# Patient Record
Sex: Female | Born: 1950 | Race: Black or African American | Hispanic: No | Smoking: Current every day smoker
Health system: Southern US, Community
[De-identification: ages and names within clinical notes are randomized; demographics above are authoritative.]

## PROBLEM LIST (undated history)

## (undated) DIAGNOSIS — F419 Anxiety disorder, unspecified: Secondary | ICD-10-CM

## (undated) DIAGNOSIS — F329 Major depressive disorder, single episode, unspecified: Secondary | ICD-10-CM

## (undated) DIAGNOSIS — F32A Depression, unspecified: Secondary | ICD-10-CM

## (undated) DIAGNOSIS — G709 Myoneural disorder, unspecified: Secondary | ICD-10-CM

## (undated) DIAGNOSIS — M1990 Unspecified osteoarthritis, unspecified site: Secondary | ICD-10-CM

## (undated) DIAGNOSIS — E785 Hyperlipidemia, unspecified: Secondary | ICD-10-CM

## (undated) DIAGNOSIS — T7840XA Allergy, unspecified, initial encounter: Secondary | ICD-10-CM

## (undated) DIAGNOSIS — F17200 Nicotine dependence, unspecified, uncomplicated: Secondary | ICD-10-CM

## (undated) DIAGNOSIS — I10 Essential (primary) hypertension: Secondary | ICD-10-CM

## (undated) HISTORY — PX: DENTAL SURGERY: SHX609

## (undated) HISTORY — DX: Hyperlipidemia, unspecified: E78.5

## (undated) HISTORY — DX: Anxiety disorder, unspecified: F41.9

## (undated) HISTORY — DX: Unspecified osteoarthritis, unspecified site: M19.90

## (undated) HISTORY — DX: Essential (primary) hypertension: I10

## (undated) HISTORY — DX: Nicotine dependence, unspecified, uncomplicated: F17.200

## (undated) HISTORY — DX: Myoneural disorder, unspecified: G70.9

## (undated) HISTORY — DX: Allergy, unspecified, initial encounter: T78.40XA

## (undated) HISTORY — DX: Major depressive disorder, single episode, unspecified: F32.9

## (undated) HISTORY — DX: Depression, unspecified: F32.A

---

## 2008-06-25 ENCOUNTER — Encounter: Admission: RE | Admit: 2008-06-25 | Discharge: 2008-09-20 | Payer: Self-pay | Admitting: Internal Medicine

## 2008-09-22 HISTORY — PX: WRIST SURGERY: SHX841

## 2010-11-15 HISTORY — PX: COLON SURGERY: SHX602

## 2010-11-28 ENCOUNTER — Other Ambulatory Visit: Payer: Self-pay | Admitting: Internal Medicine

## 2010-12-21 ENCOUNTER — Ambulatory Visit (AMBULATORY_SURGERY_CENTER): Payer: 59 | Admitting: *Deleted

## 2010-12-21 ENCOUNTER — Encounter: Payer: Self-pay | Admitting: Internal Medicine

## 2010-12-21 VITALS — Ht 62.5 in | Wt 153.0 lb

## 2010-12-21 DIAGNOSIS — Z1211 Encounter for screening for malignant neoplasm of colon: Secondary | ICD-10-CM

## 2010-12-21 MED ORDER — SUPREP BOWEL PREP KIT 17.5-3.13-1.6 GM/177ML PO SOLN: 1.0000 | ORAL | Status: DC

## 2011-01-04 ENCOUNTER — Ambulatory Visit (AMBULATORY_SURGERY_CENTER): Payer: 59 | Admitting: Internal Medicine

## 2011-01-04 ENCOUNTER — Encounter: Payer: Self-pay | Admitting: Internal Medicine

## 2011-01-04 VITALS — BP 150/79 | HR 74 | Temp 98.1°F | Resp 12 | Ht 62.0 in | Wt 153.0 lb

## 2011-01-04 DIAGNOSIS — D126 Benign neoplasm of colon, unspecified: Secondary | ICD-10-CM

## 2011-01-04 DIAGNOSIS — Z1211 Encounter for screening for malignant neoplasm of colon: Secondary | ICD-10-CM

## 2011-01-04 MED ORDER — SODIUM CHLORIDE 0.9 % IV SOLN: 500.0000 mL | INTRAVENOUS | Status: DC

## 2011-01-04 NOTE — Patient Instructions (Signed)
Please read the handouts given to you by your recovery room nurse.    You may resume all of your routine medications today.  Try to increase the fiber in your diet.    Use your analpram as needed per Dr. Juanda Chance regarding your hemorrhoids.   Your polyp results will be mailed to you within 2 weeks.   Call us if you have any questions or concerns at [phone].   Thank-you for choosing [facility] for your healthcare needs today.

## 2011-01-05 ENCOUNTER — Telehealth: Payer: Self-pay

## 2011-01-05 NOTE — Telephone Encounter (Signed)

## 2011-01-10 ENCOUNTER — Encounter: Payer: Self-pay | Admitting: Internal Medicine

## 2012-07-15 ENCOUNTER — Other Ambulatory Visit: Payer: 59

## 2012-07-15 ENCOUNTER — Other Ambulatory Visit: Payer: Self-pay | Admitting: Geriatric Medicine

## 2012-07-15 DIAGNOSIS — I10 Essential (primary) hypertension: Secondary | ICD-10-CM

## 2012-07-15 DIAGNOSIS — E785 Hyperlipidemia, unspecified: Secondary | ICD-10-CM

## 2012-07-16 LAB — LIPID PANEL
Chol/HDL Ratio: 4.2 ratio units (ref 0.0–4.4)
Cholesterol, Total: 184 mg/dL (ref 100–199)
HDL: 44 mg/dL (ref >39)
LDL Calculated: 127 mg/dL — ABNORMAL HIGH (ref 0–99)
Triglycerides: 65 mg/dL (ref 0–149)
VLDL Cholesterol Cal: 13 mg/dL (ref 5–40)

## 2012-07-16 LAB — BASIC METABOLIC PANEL
BUN/Creatinine Ratio: 14 (ref 11–26)
BUN: 10 mg/dL (ref 8–27)
CO2: 26 mmol/L (ref 19–28)
Calcium: 9.9 mg/dL (ref 8.6–10.2)
Chloride: 104 mmol/L (ref 97–108)
Creatinine, Ser: 0.71 mg/dL (ref 0.57–1.00)
GFR calc Af Amer: 107 mL/min/{1.73_m2} (ref >59)
GFR calc non Af Amer: 93 mL/min/{1.73_m2} (ref >59)
Glucose: 96 mg/dL (ref 65–99)
Potassium: 4.4 mmol/L (ref 3.5–5.2)
Sodium: 144 mmol/L (ref 134–144)

## 2012-07-16 LAB — CBC WITH DIFFERENTIAL/PLATELET
Basophils Absolute: 0 10*3/uL (ref 0.0–0.2)
Basos: 0 % (ref 0–3)
Eos: 0 % (ref 0–5)
Eosinophils Absolute: 0 10*3/uL (ref 0.0–0.4)
HCT: 42.4 % (ref 34.0–46.6)
Hemoglobin: 13.7 g/dL (ref 11.1–15.9)
Immature Grans (Abs): 0 10*3/uL (ref 0.0–0.1)
Immature Granulocytes: 0 % (ref 0–2)
Lymphocytes Absolute: 2.2 10*3/uL (ref 0.7–3.1)
Lymphs: 33 % (ref 14–46)
MCH: 28.9 pg (ref 26.6–33.0)
MCHC: 32.3 g/dL (ref 31.5–35.7)
MCV: 90 fL (ref 79–97)
Monocytes Absolute: 0.4 10*3/uL (ref 0.1–0.9)
Monocytes: 6 % (ref 4–12)
Neutrophils Absolute: 4 10*3/uL (ref 1.4–7.0)
Neutrophils Relative %: 61 % (ref 40–74)
RBC: 4.74 x10E6/uL (ref 3.77–5.28)
RDW: 14.1 % (ref 12.3–15.4)
WBC: 6.7 10*3/uL (ref 3.4–10.8)

## 2012-07-17 ENCOUNTER — Encounter: Payer: Self-pay | Admitting: Internal Medicine

## 2012-07-17 ENCOUNTER — Ambulatory Visit (INDEPENDENT_AMBULATORY_CARE_PROVIDER_SITE_OTHER): Payer: 59 | Admitting: Internal Medicine

## 2012-07-17 VITALS — BP 142/90 | HR 72 | Temp 98.4°F | Resp 12 | Wt 154.0 lb

## 2012-07-17 DIAGNOSIS — F17200 Nicotine dependence, unspecified, uncomplicated: Secondary | ICD-10-CM | POA: Insufficient documentation

## 2012-07-17 DIAGNOSIS — E785 Hyperlipidemia, unspecified: Secondary | ICD-10-CM

## 2012-07-17 DIAGNOSIS — Z6825 Body mass index (BMI) 25.0-25.9, adult: Secondary | ICD-10-CM

## 2012-07-17 DIAGNOSIS — E663 Overweight: Secondary | ICD-10-CM | POA: Insufficient documentation

## 2012-07-17 DIAGNOSIS — L723 Sebaceous cyst: Secondary | ICD-10-CM

## 2012-07-17 DIAGNOSIS — F329 Major depressive disorder, single episode, unspecified: Secondary | ICD-10-CM

## 2012-07-17 DIAGNOSIS — F32A Depression, unspecified: Secondary | ICD-10-CM

## 2012-07-17 DIAGNOSIS — F411 Generalized anxiety disorder: Secondary | ICD-10-CM | POA: Insufficient documentation

## 2012-07-17 DIAGNOSIS — F3289 Other specified depressive episodes: Secondary | ICD-10-CM

## 2012-07-17 DIAGNOSIS — I10 Essential (primary) hypertension: Secondary | ICD-10-CM | POA: Insufficient documentation

## 2012-07-17 MED ORDER — ATORVASTATIN CALCIUM 10 MG PO TABS: 20.0000 mg | ORAL_TABLET | Freq: Every day | ORAL | Status: DC

## 2012-07-17 NOTE — Assessment & Plan Note (Signed)
Mood stable with celexa.

## 2012-07-17 NOTE — Assessment & Plan Note (Signed)
Continues to smoke.  Not yet ready to quit completely.  Uses electronic cigarette.

## 2012-07-17 NOTE — Progress Notes (Signed)
Patient ID: Lauren Rosario, female   DOB: 07-11-1950, 62 y.o.   MRN: [id number]  Allergies  Allergen Reactions  . Latex Other (See Comments)    Hands blistered and broke open  . Neosporin (Neomycin-Bacitracin Zn-Polymyx) Rash    Chief Complaint  Patient presents with  . Medical Managment of Chronic Issues  . bump behind left ear    for one month  . bump on back    for one month    HPI: Patient is a 62 y.o. AA female seen in the office today for routine f/u of chronic conditions.  Has a place on her neck and one on her back for about a month.  Has put bactroban on one on her neck and it got smaller.    Is substitute teacher, works wednesdays, works with dementia patient.    Still smokes when stressed.  Uses electronic cigarette also other times.    See problems below Review of Systems:  Review of Systems  Constitutional: Negative for fever, chills and malaise/fatigue.  Eyes: Negative for blurred vision.  Respiratory: Negative for cough, shortness of breath and wheezing.   Cardiovascular: Negative for chest pain, palpitations and leg swelling.  Gastrointestinal: Negative for heartburn, nausea, vomiting, diarrhea and constipation.  Genitourinary: Negative for dysuria, urgency and frequency.  Musculoskeletal: Negative for myalgias and back pain.  Skin: Negative for rash.       2 bumps:  One behind left ear and another on her upper back   Squeezed one behind ear and it drained--she put bacitracin on it with improvement  Has had one on back recurring again and again, but doesn't want anything done with it, no redness or drainage  Neurological: Negative for dizziness and weakness.  Endo/Heme/Allergies: Does not bruise/bleed easily.  Psychiatric/Behavioral: The patient is nervous/anxious and has insomnia.        Anxious before appts b/c of lab tests and cannot sleep, is otherwise sleeping well     Past Medical History  Diagnosis Date  . Hypertension   . Allergy     seasonal  .  Anxiety   . Depression   . Tobacco use disorder    Past Surgical History  Procedure Laterality Date  . Wrist surgery  09/22/2008    left  . Dental surgery    . Colon surgery  11/2010    Colonoscopy   Social History:   reports that she has been smoking Cigarettes.  She has been smoking about 0.50 packs per day. She has never used smokeless tobacco. She reports that  drinks alcohol. She reports that she does not use illicit drugs.  Family History  Problem Relation Age of Onset  . Stroke Mother   . Hernia Brother   . Heart disease Son     Congenital Heart disease    Medications: Patient's Medications  New Prescriptions   No medications on file  Previous Medications   ALPRAZOLAM (XANAX) 1 MG TABLET    Take 1 mg by mouth Daily. sleep   ATORVASTATIN (LIPITOR) 10 MG TABLET       CITALOPRAM (CELEXA) 20 MG TABLET    Take 20 mg by mouth At bedtime as needed.   DOXYCYCLINE (VIBRAMYCIN) 100 MG CAPSULE       HYDROCHLOROTHIAZIDE 25 MG TABLET    Take 25 mg by mouth Daily.   IBUPROFEN (ADVIL,MOTRIN) 800 MG TABLET    Take 800 mg by mouth as needed. pain  Modified Medications   No medications on file  Discontinued  Medications   No medications on file     Physical Exam:  Filed Vitals:   07/17/12 1010  BP: 142/90  Pulse: 72  Temp: 98.4 F (36.9 C)  TempSrc: Oral  Resp: 12  Weight: 154 lb (69.854 kg)  SpO2: 97%   Physical Exam  Constitutional: She is oriented to person, place, and time. She appears well-developed and well-nourished. No distress.  HENT:  Head: Normocephalic and atraumatic.  Eyes: Pupils are equal, round, and reactive to light.  Neck: Normal range of motion. No thyromegaly present.  Cardiovascular: Normal rate, regular rhythm, normal heart sounds and intact distal pulses.   Pulmonary/Chest: Effort normal and breath sounds normal.  Abdominal: Soft. Bowel sounds are normal.  Musculoskeletal: Normal range of motion.  Neurological: She is alert and oriented to  person, place, and time.  Skin: Skin is warm and dry. No rash noted. No erythema.  Sebaceous cyst with blackhead present on left upper back, none visible on left ear, but tenderness remains in lobe  Psychiatric: She has a normal mood and affect. Her behavior is normal. Judgment and thought content normal.    Labs reviewed: Basic Metabolic Panel:  Recent Labs  16/10/96 1118  NA 144  K 4.4  CL 104  CO2 26  GLUCOSE 96  BUN 10  CREATININE 0.71  CALCIUM 9.9   CBC:  Recent Labs  07/15/12 1118  WBC 6.7  NEUTROABS 4.0  HGB 13.7  HCT 42.4  MCV 90   Lipid Panel:  Recent Labs  07/15/12 1118  HDL 44  LDLCALC 127*  TRIG 65  CHOLHDL 4.2    Assessment/Plan Hyperlipidemia LDL goal < 100 LDL 127 this time, was 128 last time.  Is exercising as much as she is able 3 times per week.  Trying to eat fruits, veggies and avoid sweets, starches.  Has improved significantly, but not yet to goal.  Will increase statin to 20mg  po qhs  Essential hypertension, benign High here, but is ok when she is at work and not nervous.  Continue hctz, diet and exercise.    Anxiety state, unspecified Anxiety stable.  Flares up when she comes to the doctor.  Triggers her to smoke.    Depression Mood stable with celexa.  Tobacco use disorder Continues to smoke.  Not yet ready to quit completely.  Uses electronic cigarette.     Labs/tests ordered:  Cbc, cmp, flp before next routine visit in 6 mos.

## 2012-07-17 NOTE — Assessment & Plan Note (Signed)
High here, but is ok when she is at work and not nervous.  Continue hctz, diet and exercise.

## 2012-07-17 NOTE — Assessment & Plan Note (Signed)
LDL 127 this time, was 128 last time.  Is exercising as much as she is able 3 times per week.  Trying to eat fruits, veggies and avoid sweets, starches.  Has improved significantly, but not yet to goal.  Will increase statin to 20mg  po qhs

## 2012-07-17 NOTE — Assessment & Plan Note (Signed)
Anxiety stable.  Flares up when she comes to the doctor.  Triggers her to smoke.

## 2012-07-28 ENCOUNTER — Other Ambulatory Visit: Payer: Self-pay | Admitting: Occupational Medicine

## 2012-07-28 ENCOUNTER — Ambulatory Visit: Payer: Self-pay

## 2012-07-28 DIAGNOSIS — R52 Pain, unspecified: Secondary | ICD-10-CM

## 2012-08-12 ENCOUNTER — Other Ambulatory Visit: Payer: Self-pay | Admitting: Internal Medicine

## 2012-09-04 ENCOUNTER — Other Ambulatory Visit: Payer: Self-pay | Admitting: Internal Medicine

## 2012-10-05 ENCOUNTER — Other Ambulatory Visit: Payer: Self-pay | Admitting: Internal Medicine

## 2012-10-24 ENCOUNTER — Other Ambulatory Visit: Payer: Self-pay | Admitting: Geriatric Medicine

## 2012-10-24 MED ORDER — ALPRAZOLAM 1 MG PO TABS: 1.0000 mg | ORAL_TABLET | Freq: Every day | ORAL | Status: DC

## 2013-01-19 ENCOUNTER — Other Ambulatory Visit: Payer: 59

## 2013-01-19 DIAGNOSIS — E785 Hyperlipidemia, unspecified: Secondary | ICD-10-CM

## 2013-01-19 DIAGNOSIS — I10 Essential (primary) hypertension: Secondary | ICD-10-CM

## 2013-01-19 DIAGNOSIS — L723 Sebaceous cyst: Secondary | ICD-10-CM

## 2013-01-20 ENCOUNTER — Other Ambulatory Visit: Payer: 59

## 2013-01-20 LAB — COMPREHENSIVE METABOLIC PANEL
ALT: 20 IU/L (ref 0–32)
AST: 16 IU/L (ref 0–40)
Albumin/Globulin Ratio: 1.8 (ref 1.1–2.5)
Albumin: 4.6 g/dL (ref 3.6–4.8)
Alkaline Phosphatase: 95 IU/L (ref 39–117)
BUN/Creatinine Ratio: 19 (ref 11–26)
BUN: 15 mg/dL (ref 8–27)
CO2: 25 mmol/L (ref 18–29)
Calcium: 10.2 mg/dL (ref 8.6–10.2)
Chloride: 99 mmol/L (ref 97–108)
Creatinine, Ser: 0.79 mg/dL (ref 0.57–1.00)
GFR calc Af Amer: 94 mL/min/{1.73_m2} (ref >59)
GFR calc non Af Amer: 82 mL/min/{1.73_m2} (ref >59)
Globulin, Total: 2.5 g/dL (ref 1.5–4.5)
Glucose: 93 mg/dL (ref 65–99)
Potassium: 4.4 mmol/L (ref 3.5–5.2)
Sodium: 141 mmol/L (ref 134–144)
Total Bilirubin: 0.4 mg/dL (ref 0.0–1.2)
Total Protein: 7.1 g/dL (ref 6.0–8.5)

## 2013-01-20 LAB — CBC WITH DIFFERENTIAL/PLATELET
Basophils Absolute: 0 10*3/uL (ref 0.0–0.2)
Basos: 0 %
Eos: 0 %
Eosinophils Absolute: 0 10*3/uL (ref 0.0–0.4)
HCT: 40.8 % (ref 34.0–46.6)
Hemoglobin: 13.9 g/dL (ref 11.1–15.9)
Immature Grans (Abs): 0 10*3/uL (ref 0.0–0.1)
Immature Granulocytes: 0 %
Lymphocytes Absolute: 2.3 10*3/uL (ref 0.7–3.1)
Lymphs: 33 %
MCH: 29.1 pg (ref 26.6–33.0)
MCHC: 34.1 g/dL (ref 31.5–35.7)
MCV: 85 fL (ref 79–97)
Monocytes Absolute: 0.6 10*3/uL (ref 0.1–0.9)
Monocytes: 9 %
Neutrophils Absolute: 4.1 10*3/uL (ref 1.4–7.0)
Neutrophils Relative %: 58 %
RBC: 4.78 x10E6/uL (ref 3.77–5.28)
RDW: 14.4 % (ref 12.3–15.4)
WBC: 6.9 10*3/uL (ref 3.4–10.8)

## 2013-01-20 LAB — LIPID PANEL
Chol/HDL Ratio: 3.8 ratio units (ref 0.0–4.4)
Cholesterol, Total: 199 mg/dL (ref 100–199)
HDL: 52 mg/dL (ref >39)
LDL Calculated: 132 mg/dL — ABNORMAL HIGH (ref 0–99)
Triglycerides: 75 mg/dL (ref 0–149)
VLDL Cholesterol Cal: 15 mg/dL (ref 5–40)

## 2013-01-22 ENCOUNTER — Encounter: Payer: Self-pay | Admitting: Internal Medicine

## 2013-01-22 ENCOUNTER — Ambulatory Visit (INDEPENDENT_AMBULATORY_CARE_PROVIDER_SITE_OTHER): Payer: 59 | Admitting: Internal Medicine

## 2013-01-22 VITALS — BP 138/90 | HR 72 | Temp 98.0°F | Resp 12 | Wt 158.0 lb

## 2013-01-22 DIAGNOSIS — F17200 Nicotine dependence, unspecified, uncomplicated: Secondary | ICD-10-CM

## 2013-01-22 DIAGNOSIS — F32A Depression, unspecified: Secondary | ICD-10-CM

## 2013-01-22 DIAGNOSIS — F3289 Other specified depressive episodes: Secondary | ICD-10-CM

## 2013-01-22 DIAGNOSIS — R21 Rash and other nonspecific skin eruption: Secondary | ICD-10-CM

## 2013-01-22 DIAGNOSIS — I10 Essential (primary) hypertension: Secondary | ICD-10-CM

## 2013-01-22 DIAGNOSIS — E785 Hyperlipidemia, unspecified: Secondary | ICD-10-CM

## 2013-01-22 DIAGNOSIS — F329 Major depressive disorder, single episode, unspecified: Secondary | ICD-10-CM

## 2013-01-22 MED ORDER — ATORVASTATIN CALCIUM 20 MG PO TABS: 20.0000 mg | ORAL_TABLET | Freq: Every day | ORAL | Status: DC

## 2013-01-22 MED ORDER — DOXYCYCLINE HYCLATE 100 MG PO CAPS: 100.0000 mg | ORAL_CAPSULE | Freq: Every day | ORAL | Status: DC

## 2013-01-22 NOTE — Progress Notes (Signed)
Patient ID: Lauren Rosario, female   DOB: 14-Nov-1950, 62 y.o.   MRN: [id number] Location:  [facility]  Allergies  Allergen Reactions  . Latex Other (See Comments)    Hands blistered and broke open  . Neosporin [Neomycin-Bacitracin Zn-Polymyx] Rash    Chief Complaint  Patient presents with  . Medical Managment of Chronic Issues    6 month follow-up, discuss labs (copy given)   . URI    facial sinus pain and dry cough since Tuesday evening     HPI: Patient is a 62 y.o. black female seen in the office today for medical mgt of chronic diseases and URI.  Right side of face congested and painful since tuesday.  Has cough--no green sputum.  No fever, chills.  Other staff at [facility] also have this.  CoQ10 helping the cramps she gets from lipitor.  [facility] gave her 10mg  instead of 20mg  last time of lipitor.     Review of Systems:  Review of Systems  Constitutional: Positive for malaise/fatigue. Negative for fever, chills and diaphoresis.  HENT: Positive for congestion. Negative for sore throat.   Eyes: Negative for blurred vision.  Respiratory: Positive for cough. Negative for sputum production, shortness of breath and wheezing.   Cardiovascular: Negative for chest pain.  Gastrointestinal: Negative for abdominal pain, blood in stool and melena.  Genitourinary: Negative for dysuria and frequency.  Musculoskeletal: Positive for myalgias.       Had broken third digit right hand, but did not know it  Skin: Negative for rash.  Neurological: Positive for headaches. Negative for weakness.  Psychiatric/Behavioral: Negative for depression. The patient is not nervous/anxious and does not have insomnia.     Past Medical History  Diagnosis Date  . Hypertension   . Allergy     seasonal  . Anxiety   . Depression   . Tobacco use disorder     Past Surgical History  Procedure Laterality Date  . Wrist surgery  09/22/2008    left  .  Dental surgery    . Colon surgery  11/2010    Colonoscopy    Social History:   reports that she has been smoking Cigarettes.  She has been smoking about 0.50 packs per day. She has never used smokeless tobacco. She reports that she drinks alcohol. She reports that she does not use illicit drugs.  Family History  Problem Relation Age of Onset  . Stroke Mother   . Hernia Brother   . Heart disease Son     Congenital Heart disease    Medications: Patient's Medications  New Prescriptions   No medications on file  Previous Medications   ALPRAZOLAM (XANAX) 1 MG TABLET    Take 1 tablet (1 mg total) by mouth at bedtime. sleep   ATORVASTATIN (LIPITOR) 10 MG TABLET    Take 2 tablets (20 mg total) by mouth at bedtime.   CITALOPRAM (CELEXA) 20 MG TABLET    TAKE 1 TABLET AT BEDTIME FOR DEPRESSION   HYDROCHLOROTHIAZIDE (HYDRODIURIL) 25 MG TABLET    TAKE 1 TABLET DAILY FOR BLOOD PRESSURE   IBUPROFEN (ADVIL,MOTRIN) 800 MG TABLET    TAKE 1 TO 2 TABLETS AS NEEDED FOR PAIN  Modified Medications   No medications on file  Discontinued Medications   DOXYCYCLINE (VIBRAMYCIN) 100 MG CAPSULE         Physical Exam: Filed Vitals:   01/22/13 0838  BP: 138/90  Pulse: 72  Temp: 98 F (  36.7 C)  TempSrc: Oral  Resp: 12  Weight: 158 lb (71.668 kg)  SpO2: 99%  Physical Exam  Constitutional: She is oriented to person, place, and time. She appears well-developed and well-nourished. No distress.  HENT:  Head: Normocephalic and atraumatic.  Cardiovascular: Normal rate, regular rhythm, normal heart sounds and intact distal pulses.   Pulmonary/Chest: Effort normal and breath sounds normal. No respiratory distress.  Abdominal: Soft. Bowel sounds are normal. She exhibits no distension. There is no tenderness.  Musculoskeletal: Normal range of motion. She exhibits no edema and no tenderness.  Neurological: She is alert and oriented to person, place, and time. No cranial nerve deficit.  Skin: Skin is warm  and dry.  Psychiatric: She has a normal mood and affect.   Labs reviewed: Basic Metabolic Panel:  Recent Labs  40/98/11 1118 01/19/13 1003  NA 144 141  K 4.4 4.4  CL 104 99  CO2 26 25  GLUCOSE 96 93  BUN 10 15  CREATININE 0.71 0.79  CALCIUM 9.9 10.2   Liver Function Tests:  Recent Labs  01/19/13 1003  AST 16  ALT 20  ALKPHOS 95  BILITOT 0.4  PROT 7.1  CBC:  Recent Labs  07/15/12 1118 01/19/13 1003  WBC 6.7 6.9  NEUTROABS 4.0 4.1  HGB 13.7 13.9  HCT 42.4 40.8  MCV 90 85   Lipid Panel:  Recent Labs  07/15/12 1118 01/19/13 1003  HDL 44 52  LDLCALC 127* 132*  TRIG 65 75  CHOLHDL 4.2 3.8   Assessment/Plan 1. Hyperlipidemia LDL goal < 100 -resume 20mg  lipitor (not just 10mg  like was given from pharmacy), continue diet and exercise--HDL trending up and LDL very slowly trending down - atorvastatin (LIPITOR) 20 MG tablet; Take 1 tablet (20 mg total) by mouth at bedtime.  Dispense: 30 tablet; Refill: 3 - Lipid panel; Future - Hemoglobin A1c; Future  2. Essential hypertension, benign - at goal -CBC with Differential; Future - Comprehensive metabolic panel; Future  3. Depression - well controlled at present--mood is good.  4. Tobacco use disorder - continues to smoke and not ready to quit--can't really smoke while sick fortunately -has once again been counseled about need to quit  5. Rash and nonspecific skin eruption - on this chronically - doxycycline (VIBRAMYCIN) 100 MG capsule; Take 1 capsule (100 mg total) by mouth at bedtime.  Dispense: 30 capsule; Refill: 5  Labs/tests ordered:  Cbc, cmp, flp, hba1c  Next appt:  6 mos with labs before

## 2013-04-13 ENCOUNTER — Other Ambulatory Visit: Payer: Self-pay | Admitting: Geriatric Medicine

## 2013-05-12 ENCOUNTER — Other Ambulatory Visit: Payer: Self-pay | Admitting: Internal Medicine

## 2013-06-05 ENCOUNTER — Other Ambulatory Visit: Payer: Self-pay | Admitting: Internal Medicine

## 2013-06-08 ENCOUNTER — Other Ambulatory Visit: Payer: Self-pay | Admitting: *Deleted

## 2013-06-08 DIAGNOSIS — E785 Hyperlipidemia, unspecified: Secondary | ICD-10-CM

## 2013-06-08 MED ORDER — ATORVASTATIN CALCIUM 20 MG PO TABS: ORAL_TABLET | ORAL | Status: DC

## 2013-07-20 ENCOUNTER — Other Ambulatory Visit: Payer: 59

## 2013-07-20 DIAGNOSIS — E785 Hyperlipidemia, unspecified: Secondary | ICD-10-CM

## 2013-07-20 DIAGNOSIS — F32A Depression, unspecified: Secondary | ICD-10-CM

## 2013-07-20 DIAGNOSIS — I10 Essential (primary) hypertension: Secondary | ICD-10-CM

## 2013-07-20 DIAGNOSIS — F329 Major depressive disorder, single episode, unspecified: Secondary | ICD-10-CM

## 2013-07-21 LAB — COMPREHENSIVE METABOLIC PANEL
ALT: 16 IU/L (ref 0–32)
AST: 17 IU/L (ref 0–40)
Albumin/Globulin Ratio: 1.8 (ref 1.1–2.5)
Albumin: 4.7 g/dL (ref 3.6–4.8)
Alkaline Phosphatase: 71 IU/L (ref 39–117)
BUN/Creatinine Ratio: 24 (ref 11–26)
BUN: 19 mg/dL (ref 8–27)
CO2: 22 mmol/L (ref 18–29)
Calcium: 10 mg/dL (ref 8.7–10.3)
Chloride: 101 mmol/L (ref 97–108)
Creatinine, Ser: 0.8 mg/dL (ref 0.57–1.00)
GFR calc Af Amer: 92 mL/min/{1.73_m2} (ref >59)
GFR calc non Af Amer: 80 mL/min/{1.73_m2} (ref >59)
Globulin, Total: 2.6 g/dL (ref 1.5–4.5)
Glucose: 98 mg/dL (ref 65–99)
Potassium: 4.4 mmol/L (ref 3.5–5.2)
Sodium: 141 mmol/L (ref 134–144)
Total Bilirubin: 0.3 mg/dL (ref 0.0–1.2)
Total Protein: 7.3 g/dL (ref 6.0–8.5)

## 2013-07-21 LAB — HEMOGLOBIN A1C
Est. average glucose Bld gHb Est-mCnc: 131 mg/dL
Hgb A1c MFr Bld: 6.2 % — ABNORMAL HIGH (ref 4.8–5.6)

## 2013-07-21 LAB — CBC WITH DIFFERENTIAL/PLATELET
Basophils Absolute: 0 10*3/uL (ref 0.0–0.2)
Basos: 0 %
Eos: 0 %
Eosinophils Absolute: 0 10*3/uL (ref 0.0–0.4)
HCT: 41.7 % (ref 34.0–46.6)
Hemoglobin: 13.7 g/dL (ref 11.1–15.9)
Immature Grans (Abs): 0 10*3/uL (ref 0.0–0.1)
Immature Granulocytes: 0 %
Lymphocytes Absolute: 2.4 10*3/uL (ref 0.7–3.1)
Lymphs: 35 %
MCH: 28.5 pg (ref 26.6–33.0)
MCHC: 32.9 g/dL (ref 31.5–35.7)
MCV: 87 fL (ref 79–97)
Monocytes Absolute: 0.5 10*3/uL (ref 0.1–0.9)
Monocytes: 8 %
Neutrophils Absolute: 4 10*3/uL (ref 1.4–7.0)
Neutrophils Relative %: 57 %
RBC: 4.81 x10E6/uL (ref 3.77–5.28)
RDW: 14.3 % (ref 12.3–15.4)
WBC: 7 10*3/uL (ref 3.4–10.8)

## 2013-07-21 LAB — LIPID PANEL
Chol/HDL Ratio: 3.9 ratio units (ref 0.0–4.4)
Cholesterol, Total: 190 mg/dL (ref 100–199)
HDL: 49 mg/dL (ref >39)
LDL Calculated: 128 mg/dL — ABNORMAL HIGH (ref 0–99)
Triglycerides: 67 mg/dL (ref 0–149)
VLDL Cholesterol Cal: 13 mg/dL (ref 5–40)

## 2013-07-23 ENCOUNTER — Encounter: Payer: Self-pay | Admitting: Internal Medicine

## 2013-07-23 ENCOUNTER — Ambulatory Visit (INDEPENDENT_AMBULATORY_CARE_PROVIDER_SITE_OTHER): Payer: 59 | Admitting: Internal Medicine

## 2013-07-23 VITALS — BP 126/90 | HR 84 | Temp 98.6°F | Resp 18 | Ht 62.0 in | Wt 158.2 lb

## 2013-07-23 DIAGNOSIS — L259 Unspecified contact dermatitis, unspecified cause: Secondary | ICD-10-CM | POA: Insufficient documentation

## 2013-07-23 DIAGNOSIS — E785 Hyperlipidemia, unspecified: Secondary | ICD-10-CM

## 2013-07-23 DIAGNOSIS — R739 Hyperglycemia, unspecified: Secondary | ICD-10-CM

## 2013-07-23 DIAGNOSIS — I10 Essential (primary) hypertension: Secondary | ICD-10-CM

## 2013-07-23 DIAGNOSIS — G4700 Insomnia, unspecified: Secondary | ICD-10-CM | POA: Insufficient documentation

## 2013-07-23 DIAGNOSIS — R7309 Other abnormal glucose: Secondary | ICD-10-CM

## 2013-07-23 DIAGNOSIS — F17200 Nicotine dependence, unspecified, uncomplicated: Secondary | ICD-10-CM

## 2013-07-23 DIAGNOSIS — E663 Overweight: Secondary | ICD-10-CM

## 2013-07-23 MED ORDER — ALPRAZOLAM 1 MG PO TABS: 1.0000 mg | ORAL_TABLET | Freq: Every evening | ORAL | Status: DC | PRN

## 2013-07-23 MED ORDER — CETIRIZINE HCL 10 MG PO TABS: 10.0000 mg | ORAL_TABLET | Freq: Every day | ORAL | Status: AC

## 2013-07-23 NOTE — Progress Notes (Signed)
Patient ID: Lauren Rosario, female   DOB: 07/06/1950, 63 y.o.   MRN: [id number]   Location:  [facility]  Code Status:   Allergies  Allergen Reactions  . Latex Other (See Comments)    Hands blistered and broke open  . Neosporin [Neomycin-Bacitracin Zn-Polymyx] Rash    Chief Complaint  Patient presents with  . Follow-up    lab results    HPI: Patient is a 63 y.o. black female seen in the office today for f/u of chronic conditions.  Still back to smoking.  Did start back to exercising, but has not been back to her healthy diet yet.  Is taking ca with D due to finger fx and is back to eating cheese to get milk products.    Has rash on right arm and right side.  Has h/o contact dermatitis.  Tried new alka seltzer allergy w/o help and benadryl did not help much.    Review of Systems:  ROS   Past Medical History  Diagnosis Date  . Hypertension   . Allergy     seasonal  . Anxiety   . Depression   . Tobacco use disorder     Past Surgical History  Procedure Laterality Date  . Wrist surgery  09/22/2008    left  . Dental surgery    . Colon surgery  11/2010    Colonoscopy    Social History:   reports that she has been smoking Cigarettes.  She has been smoking about 0.50 packs per day. She has never used smokeless tobacco. She reports that she drinks alcohol. She reports that she does not use illicit drugs.  Family History  Problem Relation Age of Onset  . Stroke Mother   . Hernia Brother   . Heart disease Son     Congenital Heart disease    Medications: Patient's Medications  New Prescriptions   No medications on file  Previous Medications   ALPRAZOLAM (XANAX) 1 MG TABLET    TAKE 1 TABLET BY MOUTH EVERY NIGHT AT BEDTIME   ATORVASTATIN (LIPITOR) 20 MG TABLET    Take one tablet by mouth every night at bedtime for cholesterol   CITALOPRAM (CELEXA) 20 MG TABLET    TAKE 1 TABLET AT BEDTIME FOR DEPRESSION   DOXYCYCLINE (VIBRAMYCIN)  100 MG CAPSULE    Take 1 capsule (100 mg total) by mouth at bedtime.   HYDROCHLOROTHIAZIDE (HYDRODIURIL) 25 MG TABLET    TAKE 1 TABLET DAILY FOR BLOOD PRESSURE   IBUPROFEN (ADVIL,MOTRIN) 800 MG TABLET    TAKE 1 TO 2 TABLETS AS NEEDED FOR PAIN  Modified Medications   No medications on file  Discontinued Medications   ATORVASTATIN (LIPITOR) 20 MG TABLET    TAKE 1 TABLET BY MOUTH EVERY NIGHT AT BEDTIME     Physical Exam: Filed Vitals:   07/23/13 0758  BP: 126/90  Pulse: 84  Temp: 98.6 F (37 C)  TempSrc: Oral  Resp: 18  Height: 5\' 2"  (1.575 m)  Weight: 158 lb 3.2 oz (71.759 kg)  SpO2: 96%  Physical Exam  Constitutional: She is oriented to person, place, and time. She appears well-developed and well-nourished. No distress.  Cardiovascular: Normal rate, regular rhythm, normal heart sounds and intact distal pulses.   Pulmonary/Chest: Effort normal and breath sounds normal. No respiratory distress.  Abdominal: Bowel sounds are normal.  Musculoskeletal: Normal range of motion.  Neurological: She is alert and oriented to person, place, and time.  Skin:  Skin is warm and dry.  Urticaria of right arm mixed with healing papular single spots scattered on right side of anterior chest    Labs reviewed: Basic Metabolic Panel:  Recent Labs  01/19/13 1003 07/20/13 0946  NA 141 141  K 4.4 4.4  CL 99 101  CO2 25 22  GLUCOSE 93 98  BUN 15 19  CREATININE 0.79 0.80  CALCIUM 10.2 10.0   Liver Function Tests:  Recent Labs  01/19/13 1003 07/20/13 0946  AST 16 17  ALT 20 16  ALKPHOS 95 71  BILITOT 0.4 0.3  PROT 7.1 7.3  CBC:  Recent Labs  01/19/13 1003 07/20/13 0946  WBC 6.9 7.0  NEUTROABS 4.1 4.0  HGB 13.9 13.7  HCT 40.8 41.7  MCV 85 87   Lipid Panel:  Recent Labs  01/19/13 1003 07/20/13 0946  HDL 52 49  LDLCALC 132* 128*  TRIG 75 67  CHOLHDL 3.8 3.9   Lab Results  Component Value Date   HGBA1C 6.2* 07/20/2013    Assessment/Plan 1. Contact dermatitis -and  urticaria of right arm and right anterior/lateral chest -advised to try zyrtec at hs for this (generic ok) - cetirizine (ZYRTEC) 10 MG tablet; Take 1 tablet (10 mg total) by mouth daily.  Dispense: 30 tablet; Refill: 11  2. Insomnia -says she has to take her xanax every few nights only not truly nightly--she knows it can affect cognition long term - ALPRAZolam (XANAX) 1 MG tablet; Take 1 tablet (1 mg total) by mouth at bedtime as needed for anxiety.  Dispense: 30 tablet; Refill: 1  3. Hyperlipidemia LDL goal < 100 - LDL had improved a little, but HDL dropped as she had not been exercising or dieting properly recently--discussed getting back on her diet and using low fat versions of milk products for her calcium and vitamin D  - Lipid panel; Future  4. Essential hypertension, benign -bp is at goal with current meds--diastolic a little up this am -smokes and not ready to quit (has quit a couple of times before and now back to it)  5. Tobacco use disorder -is well aware of the risks of this ongoing habit -is not ready to quit right now--this is her "one bad habit" she uses for nerves - CBC With differential/Platelet; Future  6. Overweight (BMI 25.0-29.9) -encouraged her to get back to going to gym and on her diet--she knows what to do - Basic metabolic panel; Future -  7. Hyperglycemia -has fh/o diabetes -will work on diet and exercise - Hemoglobin A1c; Future  Labs/tests ordered:   Orders Placed This Encounter  Procedures  . CBC With differential/Platelet    Standing Status: Future     Number of Occurrences:      Standing Expiration Date: 07/24/2014  . Basic metabolic panel    Standing Status: Future     Number of Occurrences:      Standing Expiration Date: 07/24/2014  . Lipid panel    Standing Status: Future     Number of Occurrences:      Standing Expiration Date: 07/24/2014  . Hemoglobin A1c    Standing Status: Future     Number of Occurrences:      Standing Expiration  Date: 07/24/2014    Next appt:  6 mos labs before

## 2013-08-05 ENCOUNTER — Other Ambulatory Visit: Payer: Self-pay | Admitting: Internal Medicine

## 2013-10-16 ENCOUNTER — Other Ambulatory Visit: Payer: Self-pay | Admitting: Internal Medicine

## 2014-01-04 ENCOUNTER — Other Ambulatory Visit: Payer: Self-pay | Admitting: *Deleted

## 2014-01-04 DIAGNOSIS — G4700 Insomnia, unspecified: Secondary | ICD-10-CM

## 2014-01-04 MED ORDER — ALPRAZOLAM 1 MG PO TABS: 1.0000 mg | ORAL_TABLET | Freq: Every evening | ORAL | Status: DC | PRN

## 2014-01-09 ENCOUNTER — Other Ambulatory Visit: Payer: Self-pay | Admitting: Internal Medicine

## 2014-01-11 ENCOUNTER — Other Ambulatory Visit: Payer: Self-pay | Admitting: Internal Medicine

## 2014-01-19 ENCOUNTER — Other Ambulatory Visit: Payer: 59

## 2014-01-21 ENCOUNTER — Ambulatory Visit: Payer: 59 | Admitting: Internal Medicine

## 2014-03-25 ENCOUNTER — Other Ambulatory Visit: Payer: Self-pay | Admitting: Internal Medicine

## 2014-04-09 ENCOUNTER — Other Ambulatory Visit: Payer: Self-pay | Admitting: Internal Medicine

## 2014-06-18 ENCOUNTER — Other Ambulatory Visit: Payer: Self-pay | Admitting: Internal Medicine

## 2014-06-23 ENCOUNTER — Other Ambulatory Visit: Payer: Self-pay | Admitting: Internal Medicine

## 2014-07-29 ENCOUNTER — Telehealth: Payer: Self-pay

## 2014-07-29 NOTE — Telephone Encounter (Signed)
Left message on voicemail informing patient she is overdue labs and an appointment. Patient instructed to call and schedule appointments is she wishes to continue her care under Dr.Reed.

## 2014-07-29 NOTE — Telephone Encounter (Signed)
-----   Message from Gayland Curry, DO sent at 07/29/2014  9:55 AM EDT ----- Please call Pam and let her know that she missed her labs.  They have expired.  She needs to come back in and get them done.   ----- Message -----    From: SYSTEM    Sent: 07/29/2014  12:06 AM      To: Gayland Curry, DO

## 2014-08-09 ENCOUNTER — Telehealth: Payer: Self-pay | Admitting: Internal Medicine

## 2014-08-09 NOTE — Telephone Encounter (Signed)
Called patient and left a message for her to please return my call. According to our records patient is due for a mammogram and I called to get approval to place an order for her to get that done

## 2016-10-01 ENCOUNTER — Other Ambulatory Visit: Payer: Self-pay | Admitting: Internal Medicine

## 2016-10-01 DIAGNOSIS — Z1231 Encounter for screening mammogram for malignant neoplasm of breast: Secondary | ICD-10-CM

## 2016-10-19 ENCOUNTER — Ambulatory Visit
Admission: RE | Admit: 2016-10-19 | Discharge: 2016-10-19 | Disposition: A | Discharge status: Home / Self Care | Payer: Managed Care, Other (non HMO) | Source: Ambulatory Visit | Attending: Internal Medicine | Admitting: Internal Medicine

## 2016-10-19 DIAGNOSIS — Z1231 Encounter for screening mammogram for malignant neoplasm of breast: Secondary | ICD-10-CM | POA: Insufficient documentation

## 2017-10-28 ENCOUNTER — Other Ambulatory Visit: Payer: Self-pay | Admitting: Internal Medicine

## 2017-10-28 DIAGNOSIS — Z1231 Encounter for screening mammogram for malignant neoplasm of breast: Secondary | ICD-10-CM

## 2017-11-13 ENCOUNTER — Ambulatory Visit
Admission: RE | Admit: 2017-11-13 | Discharge: 2017-11-13 | Disposition: A | Discharge status: Home / Self Care | Payer: No Typology Code available for payment source | Source: Ambulatory Visit | Attending: Internal Medicine | Admitting: Internal Medicine

## 2017-11-13 DIAGNOSIS — Z1231 Encounter for screening mammogram for malignant neoplasm of breast: Secondary | ICD-10-CM | POA: Diagnosis not present

## 2019-04-21 DIAGNOSIS — Z20828 Contact with and (suspected) exposure to other viral communicable diseases: Secondary | ICD-10-CM | POA: Diagnosis not present

## 2019-04-24 DIAGNOSIS — Z20828 Contact with and (suspected) exposure to other viral communicable diseases: Secondary | ICD-10-CM | POA: Diagnosis not present

## 2019-04-27 DIAGNOSIS — R7301 Impaired fasting glucose: Secondary | ICD-10-CM | POA: Diagnosis not present

## 2019-04-27 DIAGNOSIS — E7800 Pure hypercholesterolemia, unspecified: Secondary | ICD-10-CM | POA: Diagnosis not present

## 2019-04-27 DIAGNOSIS — I10 Essential (primary) hypertension: Secondary | ICD-10-CM | POA: Diagnosis not present

## 2019-05-01 DIAGNOSIS — Z20828 Contact with and (suspected) exposure to other viral communicable diseases: Secondary | ICD-10-CM | POA: Diagnosis not present

## 2019-05-04 DIAGNOSIS — Z79899 Other long term (current) drug therapy: Secondary | ICD-10-CM | POA: Diagnosis not present

## 2019-05-04 DIAGNOSIS — I10 Essential (primary) hypertension: Secondary | ICD-10-CM | POA: Diagnosis not present

## 2019-05-04 DIAGNOSIS — R7301 Impaired fasting glucose: Secondary | ICD-10-CM | POA: Diagnosis not present

## 2019-05-04 DIAGNOSIS — F17200 Nicotine dependence, unspecified, uncomplicated: Secondary | ICD-10-CM | POA: Diagnosis not present

## 2019-05-04 DIAGNOSIS — J069 Acute upper respiratory infection, unspecified: Secondary | ICD-10-CM | POA: Diagnosis not present

## 2019-05-04 DIAGNOSIS — Z20822 Contact with and (suspected) exposure to covid-19: Secondary | ICD-10-CM | POA: Diagnosis not present

## 2019-05-04 DIAGNOSIS — E7800 Pure hypercholesterolemia, unspecified: Secondary | ICD-10-CM | POA: Diagnosis not present

## 2019-05-05 DIAGNOSIS — Z20828 Contact with and (suspected) exposure to other viral communicable diseases: Secondary | ICD-10-CM | POA: Diagnosis not present

## 2019-05-08 DIAGNOSIS — Z20828 Contact with and (suspected) exposure to other viral communicable diseases: Secondary | ICD-10-CM | POA: Diagnosis not present

## 2019-05-12 DIAGNOSIS — Z20822 Contact with and (suspected) exposure to covid-19: Secondary | ICD-10-CM | POA: Diagnosis not present

## 2019-05-29 DIAGNOSIS — Z20828 Contact with and (suspected) exposure to other viral communicable diseases: Secondary | ICD-10-CM | POA: Diagnosis not present

## 2019-06-09 DIAGNOSIS — Z20822 Contact with and (suspected) exposure to covid-19: Secondary | ICD-10-CM | POA: Diagnosis not present

## 2019-06-16 DIAGNOSIS — Z20828 Contact with and (suspected) exposure to other viral communicable diseases: Secondary | ICD-10-CM | POA: Diagnosis not present

## 2019-06-23 DIAGNOSIS — Z20828 Contact with and (suspected) exposure to other viral communicable diseases: Secondary | ICD-10-CM | POA: Diagnosis not present

## 2019-06-30 DIAGNOSIS — Z20828 Contact with and (suspected) exposure to other viral communicable diseases: Secondary | ICD-10-CM | POA: Diagnosis not present

## 2019-08-03 DIAGNOSIS — U071 COVID-19: Secondary | ICD-10-CM | POA: Diagnosis not present

## 2019-08-03 DIAGNOSIS — Z20828 Contact with and (suspected) exposure to other viral communicable diseases: Secondary | ICD-10-CM | POA: Diagnosis not present

## 2019-08-04 DIAGNOSIS — U071 COVID-19: Secondary | ICD-10-CM | POA: Diagnosis not present

## 2019-08-04 DIAGNOSIS — Z20828 Contact with and (suspected) exposure to other viral communicable diseases: Secondary | ICD-10-CM | POA: Diagnosis not present

## 2019-08-10 DIAGNOSIS — Z20828 Contact with and (suspected) exposure to other viral communicable diseases: Secondary | ICD-10-CM | POA: Diagnosis not present

## 2019-08-10 DIAGNOSIS — U071 COVID-19: Secondary | ICD-10-CM | POA: Diagnosis not present

## 2019-08-17 DIAGNOSIS — U071 COVID-19: Secondary | ICD-10-CM | POA: Diagnosis not present

## 2019-08-17 DIAGNOSIS — Z20828 Contact with and (suspected) exposure to other viral communicable diseases: Secondary | ICD-10-CM | POA: Diagnosis not present

## 2019-08-24 DIAGNOSIS — U071 COVID-19: Secondary | ICD-10-CM | POA: Diagnosis not present

## 2019-08-24 DIAGNOSIS — Z20828 Contact with and (suspected) exposure to other viral communicable diseases: Secondary | ICD-10-CM | POA: Diagnosis not present

## 2019-08-25 IMAGING — MG MM DIGITAL SCREENING BILAT W/ TOMO W/ CAD
8 series · 8 of 24 positions shown · non-contrast
Comparison: Previous exam(s).

CLINICAL DATA: Screening.

EXAM:
DIGITAL SCREENING BILATERAL MAMMOGRAM WITH TOMO AND CAD

[L CC synth-2D]
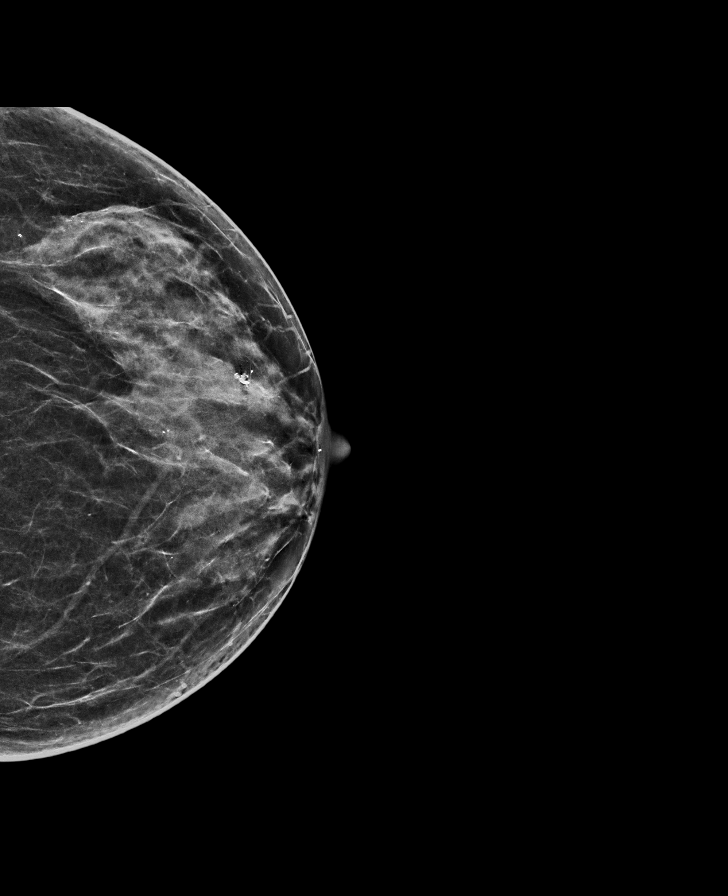

[R MLO synth-2D]
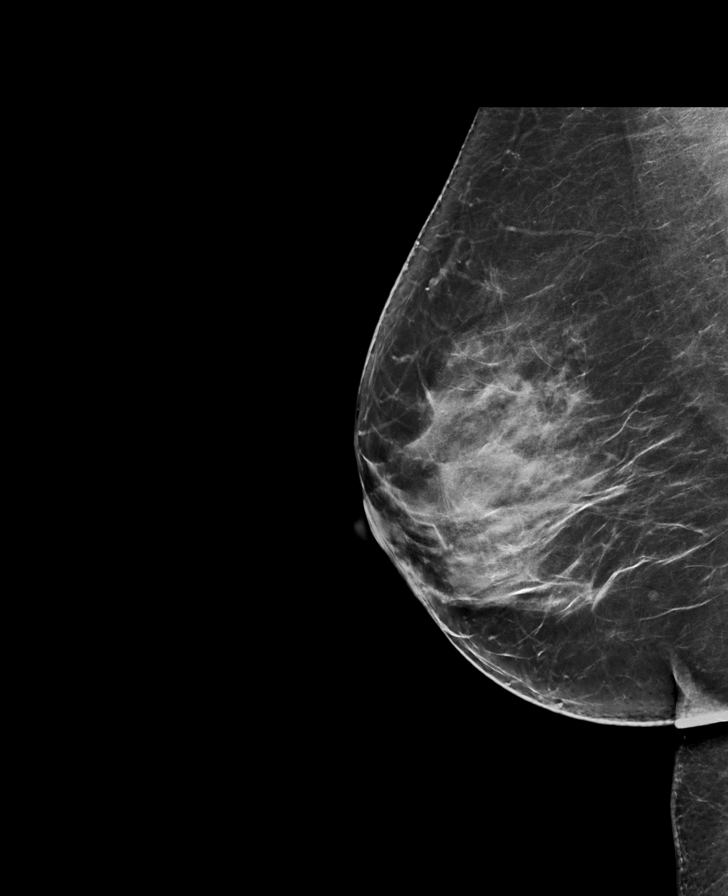

[R CC synth-2D]
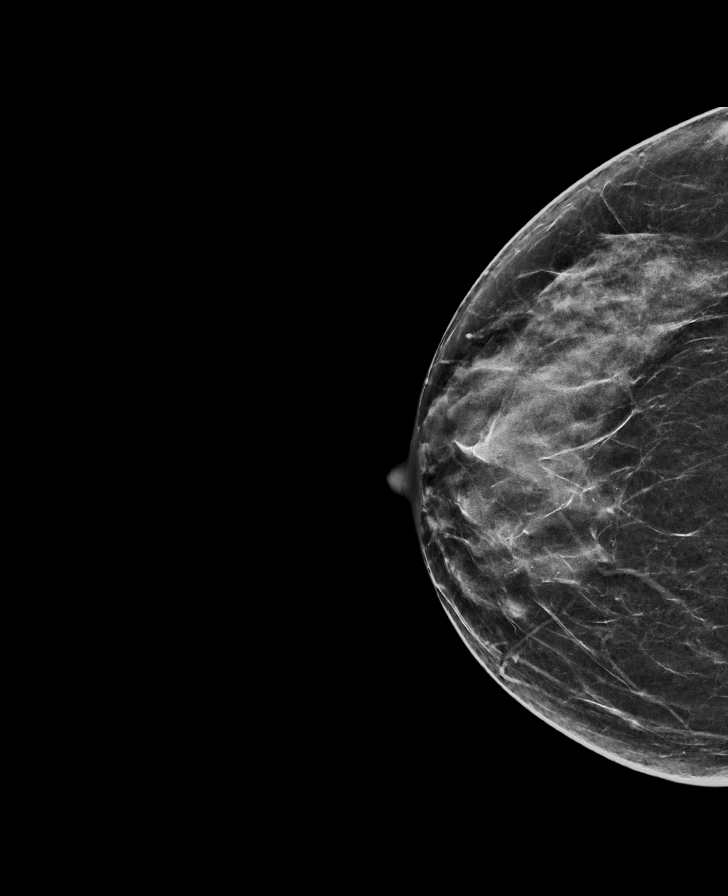

[L MLO synth-2D]
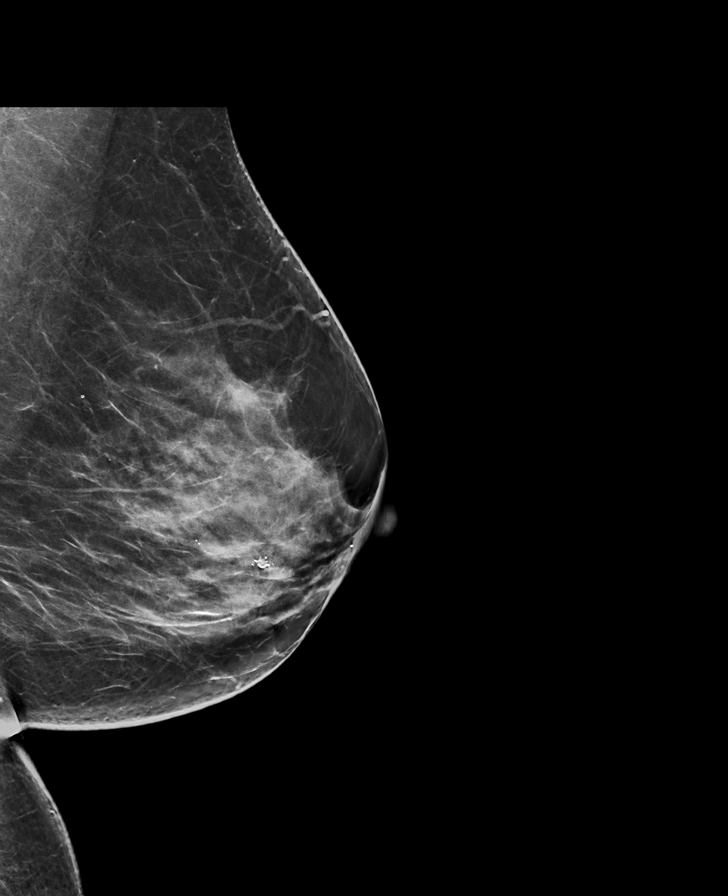

[R MLO tomo · tomo slice 39/76.0]
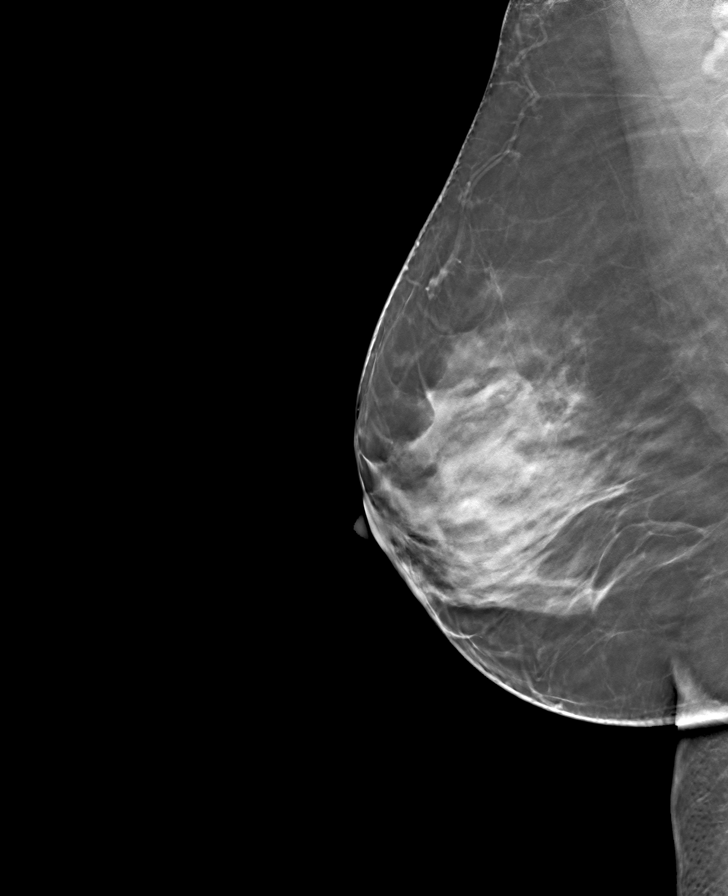

[R CC tomo · tomo slice 33/64.0]
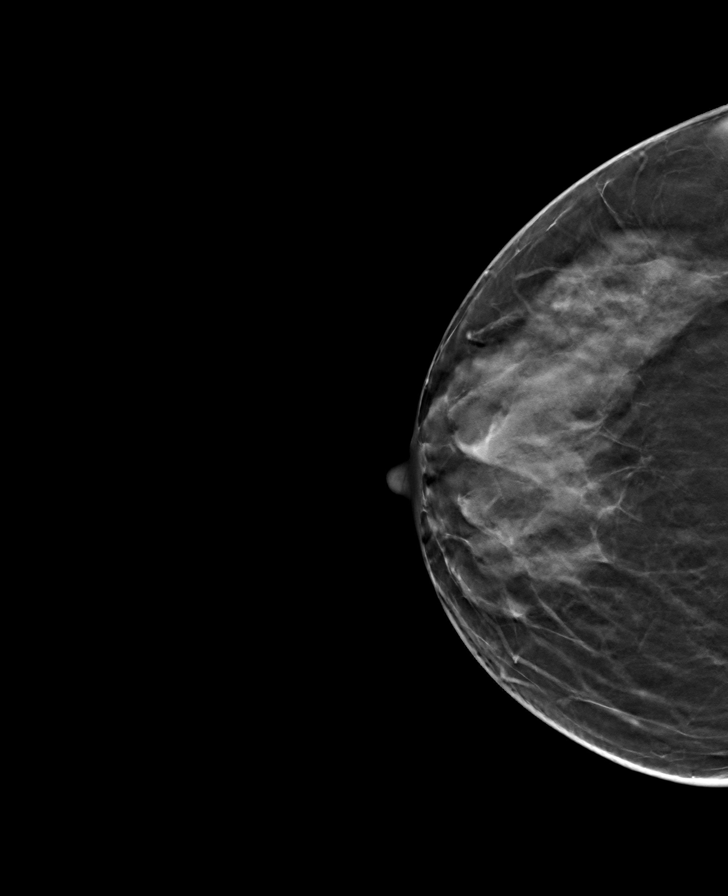

[L CC tomo · tomo slice 33/64.0]
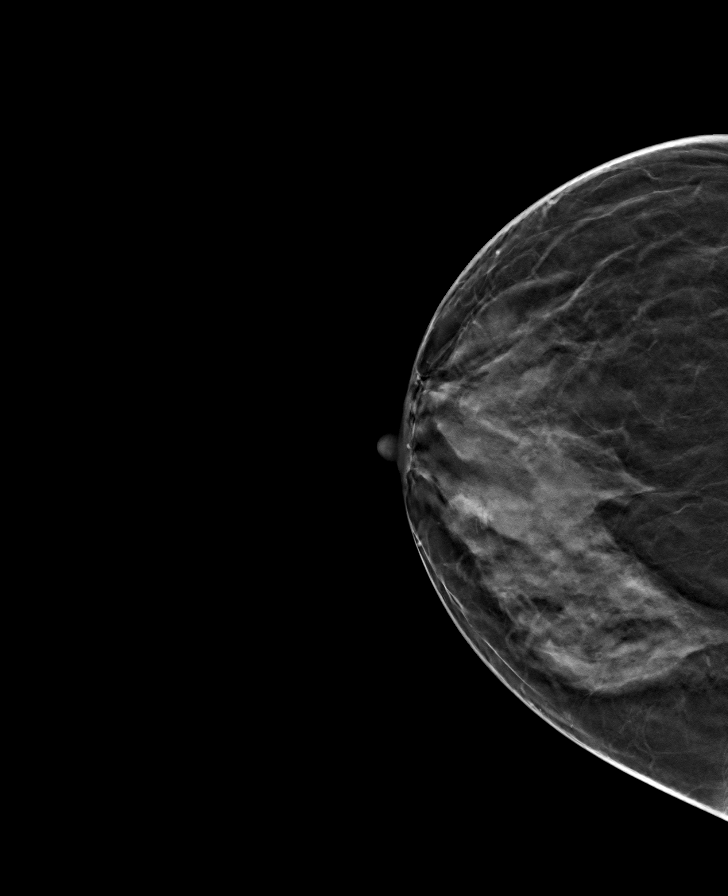

[L MLO tomo · tomo slice 37/74.0]
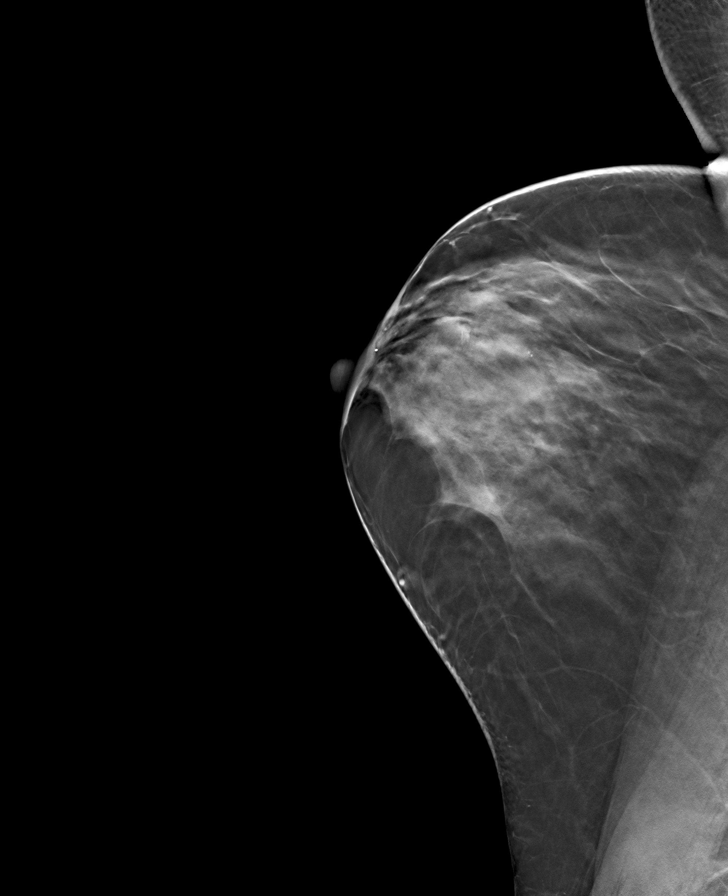

[8 of 24 positions shown; findings below may reference images not displayed]

ACR Breast Density Category c: The breast tissue is heterogeneously
dense, which may obscure small masses.
FINDINGS: There are no findings suspicious for malignancy. Images were
processed with CAD.
IMPRESSION: No mammographic evidence of malignancy. A result letter of this
screening mammogram will be mailed directly to the patient.

RECOMMENDATION:
Screening mammogram in one year. (Code:[id number]-[id number])

BI-RADS CATEGORY  1: Negative.

## 2019-08-31 DIAGNOSIS — Z20828 Contact with and (suspected) exposure to other viral communicable diseases: Secondary | ICD-10-CM | POA: Diagnosis not present

## 2019-08-31 DIAGNOSIS — U071 COVID-19: Secondary | ICD-10-CM | POA: Diagnosis not present

## 2019-09-01 DIAGNOSIS — Z20828 Contact with and (suspected) exposure to other viral communicable diseases: Secondary | ICD-10-CM | POA: Diagnosis not present

## 2019-09-01 DIAGNOSIS — U071 COVID-19: Secondary | ICD-10-CM | POA: Diagnosis not present

## 2019-09-09 DIAGNOSIS — Z20828 Contact with and (suspected) exposure to other viral communicable diseases: Secondary | ICD-10-CM | POA: Diagnosis not present

## 2019-09-09 DIAGNOSIS — U071 COVID-19: Secondary | ICD-10-CM | POA: Diagnosis not present

## 2019-09-21 DIAGNOSIS — Z20828 Contact with and (suspected) exposure to other viral communicable diseases: Secondary | ICD-10-CM | POA: Diagnosis not present

## 2019-09-21 DIAGNOSIS — U071 COVID-19: Secondary | ICD-10-CM | POA: Diagnosis not present

## 2019-09-22 DIAGNOSIS — U071 COVID-19: Secondary | ICD-10-CM | POA: Diagnosis not present

## 2019-09-22 DIAGNOSIS — Z20828 Contact with and (suspected) exposure to other viral communicable diseases: Secondary | ICD-10-CM | POA: Diagnosis not present

## 2019-09-28 DIAGNOSIS — U071 COVID-19: Secondary | ICD-10-CM | POA: Diagnosis not present

## 2019-09-28 DIAGNOSIS — Z20828 Contact with and (suspected) exposure to other viral communicable diseases: Secondary | ICD-10-CM | POA: Diagnosis not present

## 2019-10-26 DIAGNOSIS — I10 Essential (primary) hypertension: Secondary | ICD-10-CM | POA: Diagnosis not present

## 2019-10-26 DIAGNOSIS — R7301 Impaired fasting glucose: Secondary | ICD-10-CM | POA: Diagnosis not present

## 2019-10-26 DIAGNOSIS — E7800 Pure hypercholesterolemia, unspecified: Secondary | ICD-10-CM | POA: Diagnosis not present

## 2019-11-02 DIAGNOSIS — N2889 Other specified disorders of kidney and ureter: Secondary | ICD-10-CM | POA: Diagnosis not present

## 2019-11-02 DIAGNOSIS — M5440 Lumbago with sciatica, unspecified side: Secondary | ICD-10-CM | POA: Diagnosis not present

## 2019-11-02 DIAGNOSIS — I700 Atherosclerosis of aorta: Secondary | ICD-10-CM | POA: Diagnosis not present

## 2019-11-02 DIAGNOSIS — M160 Bilateral primary osteoarthritis of hip: Secondary | ICD-10-CM | POA: Diagnosis not present

## 2019-11-02 DIAGNOSIS — I10 Essential (primary) hypertension: Secondary | ICD-10-CM | POA: Diagnosis not present

## 2019-11-02 DIAGNOSIS — E7800 Pure hypercholesterolemia, unspecified: Secondary | ICD-10-CM | POA: Diagnosis not present

## 2019-11-02 DIAGNOSIS — F17210 Nicotine dependence, cigarettes, uncomplicated: Secondary | ICD-10-CM | POA: Diagnosis not present

## 2019-11-02 DIAGNOSIS — R7301 Impaired fasting glucose: Secondary | ICD-10-CM | POA: Diagnosis not present

## 2019-11-02 DIAGNOSIS — Z0000 Encounter for general adult medical examination without abnormal findings: Secondary | ICD-10-CM | POA: Diagnosis not present

## 2019-11-02 DIAGNOSIS — M25552 Pain in left hip: Secondary | ICD-10-CM | POA: Diagnosis not present

## 2019-11-02 DIAGNOSIS — I878 Other specified disorders of veins: Secondary | ICD-10-CM | POA: Diagnosis not present

## 2019-11-02 DIAGNOSIS — I739 Peripheral vascular disease, unspecified: Secondary | ICD-10-CM | POA: Diagnosis not present

## 2019-11-02 DIAGNOSIS — M47816 Spondylosis without myelopathy or radiculopathy, lumbar region: Secondary | ICD-10-CM | POA: Diagnosis not present

## 2019-11-02 DIAGNOSIS — M1612 Unilateral primary osteoarthritis, left hip: Secondary | ICD-10-CM | POA: Diagnosis not present

## 2019-11-10 DIAGNOSIS — M7062 Trochanteric bursitis, left hip: Secondary | ICD-10-CM | POA: Diagnosis not present

## 2019-11-10 DIAGNOSIS — M5136 Other intervertebral disc degeneration, lumbar region: Secondary | ICD-10-CM | POA: Diagnosis not present

## 2019-11-10 DIAGNOSIS — M5416 Radiculopathy, lumbar region: Secondary | ICD-10-CM | POA: Diagnosis not present

## 2019-11-11 DIAGNOSIS — M4726 Other spondylosis with radiculopathy, lumbar region: Secondary | ICD-10-CM | POA: Diagnosis not present

## 2019-11-11 DIAGNOSIS — M545 Low back pain: Secondary | ICD-10-CM | POA: Diagnosis not present

## 2020-02-05 ENCOUNTER — Other Ambulatory Visit: Payer: Self-pay | Admitting: Internal Medicine

## 2020-02-05 DIAGNOSIS — Z1231 Encounter for screening mammogram for malignant neoplasm of breast: Secondary | ICD-10-CM

## 2020-03-15 ENCOUNTER — Other Ambulatory Visit: Payer: Self-pay

## 2020-03-15 ENCOUNTER — Ambulatory Visit
Admission: RE | Admit: 2020-03-15 | Discharge: 2020-03-15 | Disposition: A | Discharge status: Home / Self Care | Payer: Medicare HMO | Source: Ambulatory Visit | Attending: Internal Medicine | Admitting: Internal Medicine

## 2020-03-15 DIAGNOSIS — Z1231 Encounter for screening mammogram for malignant neoplasm of breast: Secondary | ICD-10-CM | POA: Diagnosis present

## 2021-03-20 ENCOUNTER — Ambulatory Visit (LOCAL_COMMUNITY_HEALTH_CENTER): Payer: Medicare HMO

## 2021-03-20 ENCOUNTER — Other Ambulatory Visit: Payer: Self-pay

## 2021-03-20 DIAGNOSIS — Z111 Encounter for screening for respiratory tuberculosis: Secondary | ICD-10-CM

## 2021-03-23 ENCOUNTER — Other Ambulatory Visit: Payer: Self-pay

## 2021-03-23 ENCOUNTER — Ambulatory Visit (LOCAL_COMMUNITY_HEALTH_CENTER): Payer: Medicare HMO

## 2021-03-23 DIAGNOSIS — Z111 Encounter for screening for respiratory tuberculosis: Secondary | ICD-10-CM

## 2021-03-23 LAB — TB SKIN TEST
Induration: 0 mm
TB Skin Test: NEGATIVE

## 2021-03-23 NOTE — Progress Notes (Signed)
PPDR Negative 0 mm

## 2021-06-26 ENCOUNTER — Telehealth: Payer: Self-pay | Admitting: Internal Medicine

## 2021-06-26 NOTE — Telephone Encounter (Signed)
Pt called in to schedule new pt appt. Please call back ?

## 2021-07-10 ENCOUNTER — Ambulatory Visit: Payer: PRIVATE HEALTH INSURANCE | Admitting: Adult Health

## 2021-09-29 ENCOUNTER — Ambulatory Visit (INDEPENDENT_AMBULATORY_CARE_PROVIDER_SITE_OTHER): Payer: Medicare Other | Admitting: Internal Medicine

## 2021-09-29 ENCOUNTER — Encounter: Payer: Self-pay | Admitting: Internal Medicine

## 2021-09-29 VITALS — BP 138/88 | HR 82 | Temp 98.5°F | Resp 16 | Ht 61.0 in | Wt 152.8 lb

## 2021-09-29 DIAGNOSIS — M5416 Radiculopathy, lumbar region: Secondary | ICD-10-CM

## 2021-09-29 DIAGNOSIS — I10 Essential (primary) hypertension: Secondary | ICD-10-CM

## 2021-09-29 DIAGNOSIS — E785 Hyperlipidemia, unspecified: Secondary | ICD-10-CM | POA: Diagnosis not present

## 2021-09-29 DIAGNOSIS — Z13820 Encounter for screening for osteoporosis: Secondary | ICD-10-CM

## 2021-09-29 DIAGNOSIS — Z1159 Encounter for screening for other viral diseases: Secondary | ICD-10-CM

## 2021-09-29 DIAGNOSIS — R7303 Prediabetes: Secondary | ICD-10-CM | POA: Diagnosis not present

## 2021-09-29 DIAGNOSIS — Z23 Encounter for immunization: Secondary | ICD-10-CM

## 2021-09-29 DIAGNOSIS — L709 Acne, unspecified: Secondary | ICD-10-CM

## 2021-09-29 DIAGNOSIS — J302 Other seasonal allergic rhinitis: Secondary | ICD-10-CM

## 2021-09-29 DIAGNOSIS — E2839 Other primary ovarian failure: Secondary | ICD-10-CM

## 2021-09-29 DIAGNOSIS — G8929 Other chronic pain: Secondary | ICD-10-CM

## 2021-09-29 DIAGNOSIS — Z1231 Encounter for screening mammogram for malignant neoplasm of breast: Secondary | ICD-10-CM

## 2021-09-29 DIAGNOSIS — M25552 Pain in left hip: Secondary | ICD-10-CM

## 2021-09-29 MED ORDER — CLINDAMYCIN PHOSPHATE 1 % EX GEL: Freq: Two times a day (BID) | CUTANEOUS | 0 refills | Status: DC

## 2021-09-29 MED ORDER — HYDROCHLOROTHIAZIDE 25 MG PO TABS: 25.0000 mg | ORAL_TABLET | Freq: Every day | ORAL | 1 refills | Status: DC

## 2021-09-29 MED ORDER — MELOXICAM 15 MG PO TABS: 15.0000 mg | ORAL_TABLET | Freq: Every day | ORAL | 0 refills | Status: DC

## 2021-09-29 MED ORDER — ATORVASTATIN CALCIUM 20 MG PO TABS: ORAL_TABLET | ORAL | 5 refills | Status: DC

## 2021-09-29 MED ORDER — GABAPENTIN 100 MG PO CAPS: 100.0000 mg | ORAL_CAPSULE | Freq: Three times a day (TID) | ORAL | 3 refills | Status: DC

## 2021-09-29 NOTE — Patient Instructions (Addendum)
It was great seeing you today!  Plan discussed at today's visit: -Blood work ordered today, results will be uploaded to [place].  -Medications refilled today -Mammogram and bone scan ordered, please call number on card to schedule -Please go to [facility] on [address]. For x-rays of hip and back, can go any weekday from 8-5  -Try new nasal spray -Pneumonia vaccine given today -Ask pharmacy about tetanus vaccine   Follow up in: 6 months   Take care and let us know if you have any questions or concerns prior to your next visit.  Dr. Rosana Berger

## 2021-09-29 NOTE — Progress Notes (Signed)
New Patient Office Visit  Subjective    Patient ID: Lauren Rosario, female    DOB: Oct 19, 1950  Age: 71 y.o. MRN: [id number]  CC:  Chief Complaint  Patient presents with   Establish Care    HPI Lauren Rosario presents to establish care  Hypertension: -Medications: HCTZ 25 mg - out of medication for a couple days  -Patient is compliant with above medications and reports no side effects. -Checking BP at home (average): 130/70 -Denies any SOB, CP, vision changes, LE edema or symptoms of hypotension -Exercise: starting to walk a few days a week   HLD: -Medications: Lipitor 20 mg  -Patient is compliant with above medications and reports no side effects.  -Last lipid panel: Lipid Panel     Component Value Date/Time   CHOL 190 07/20/2013 0946   TRIG 67 07/20/2013 0946   HDL 49 07/20/2013 0946   CHOLHDL 3.9 07/20/2013 0946   LDLCALC 128 (H) 07/20/2013 0946   LABVLDL 13 07/20/2013 0946   Radicular Lumbar Pain/Neuropathy/Left Hip Pain: -Currently on Gabapentin 100 TID and Mobic 15 mg daily -Had seen Orthopedics - had x-rays done in 2021 but I cannot view these results -Just got done with PT  Seasonal Allergies: -Currently on Zyrtec 10 mg daily, has chronic sinuitis issues  Pre-Diabetes: -A1c 7/22 6.1%, 3/23 5.2%  Acne: -On side of face and back - had been on oral Doxycycline for this in the past   Health Maintenance: -Blood work due -Mammogram and bone scan due -Colon cancer screening: colonoscopy in the last 10 years, obtaining records -Prevnar 20 due -Tdap due - will discuss with pharmacy or health department due to insurance coverage    Outpatient Encounter Medications as of 09/29/2021  Medication Sig   atorvastatin (LIPITOR) 20 MG tablet Take one tablet by mouth every night at bedtime for cholesterol   cetirizine (ZYRTEC) 10 MG tablet Take 1 tablet (10 mg total) by mouth daily.   doxycycline (VIBRAMYCIN) 100 MG capsule Take 1 capsule (100 mg total) by mouth at  bedtime.   gabapentin (NEURONTIN) 100 MG capsule Take 100 mg by mouth 3 (three) times daily.   hydrochlorothiazide (HYDRODIURIL) 25 MG tablet TAKE 1 TABLET DAILY FOR BLOOD PRESSURE   meloxicam (MOBIC) 7.5 MG tablet Take 7.5 mg by mouth daily.   [DISCONTINUED] ALPRAZolam (XANAX) 1 MG tablet Take 1 tablet (1 mg total) by mouth at bedtime as needed for anxiety. (Patient not taking: Reported on 03/20/2021)   [DISCONTINUED] citalopram (CELEXA) 20 MG tablet TAKE 1 TABLET AT BEDTIME FOR DEPRESSION (Patient not taking: Reported on 03/20/2021)   [DISCONTINUED] ibuprofen (ADVIL,MOTRIN) 800 MG tablet APPOINTMENT NEEDED. Take 1 to 2 tablets as needed for pain (Patient not taking: Reported on 03/20/2021)   No facility-administered encounter medications on file as of 09/29/2021.    Past Medical History:  Diagnosis Date   Allergy    seasonal   Anxiety    Arthritis    Depression    Hyperlipidemia    Hypertension    Neuromuscular disorder ([place])    Tobacco use disorder     Past Surgical History:  Procedure Laterality Date   COLON SURGERY  11/2010   Colonoscopy   DENTAL SURGERY     WRIST SURGERY  09/22/2008   left    Family History  Problem Relation Age of Onset   Stroke Mother    Hernia Brother    Heart disease Son        Congenital Heart disease   Breast  cancer Maternal Aunt 80    Social History   Socioeconomic History   Marital status: Divorced    Spouse name: Not on file   Number of children: Not on file   Years of education: Not on file   Highest education level: Not on file  Occupational History   Not on file  Tobacco Use   Smoking status: Every Day    Packs/day: 0.50    Types: Cigarettes   Smokeless tobacco: Never  Vaping Use   Vaping Use: Never used  Substance and Sexual Activity   Alcohol use: Not Currently   Drug use: No   Sexual activity: Not Currently  Other Topics Concern   Not on file  Social History Narrative   Not on file   Social Determinants of Health    Financial Resource Strain: Not on file  Food Insecurity: Not on file  Transportation Needs: Not on file  Physical Activity: Not on file  Stress: Not on file  Social Connections: Not on file  Intimate Partner Violence: Not on file    Review of Systems  Constitutional:  Negative for chills and fever.  Eyes:  Positive for blurred vision (worsening cataracts).  Respiratory:  Negative for shortness of breath.   Cardiovascular:  Negative for chest pain.  Gastrointestinal:  Negative for abdominal pain, blood in stool, diarrhea, melena, nausea and vomiting.  Musculoskeletal:  Positive for back pain and joint pain.  Neurological:  Positive for sensory change.        Objective    BP 138/88   Pulse 82   Temp 98.5 F (36.9 C)   Resp 16   Ht '5\' 1"'$  (1.549 m)   Wt 152 lb 12.8 oz (69.3 kg)   SpO2 99%   BMI 28.87 kg/m   Physical Exam Constitutional:      Appearance: Normal appearance.  HENT:     Head: Normocephalic and atraumatic.     Nose: Nose normal.     Mouth/Throat:     Mouth: Mucous membranes are moist.     Pharynx: Oropharynx is clear.  Eyes:     Extraocular Movements: Extraocular movements intact.     Conjunctiva/sclera: Conjunctivae normal.     Pupils: Pupils are equal, round, and reactive to light.  Cardiovascular:     Rate and Rhythm: Normal rate and regular rhythm.  Pulmonary:     Effort: Pulmonary effort is normal.     Breath sounds: Normal breath sounds.  Musculoskeletal:     Right lower leg: No edema.     Left lower leg: No edema.  Lymphadenopathy:     Cervical: No cervical adenopathy.  Skin:    General: Skin is warm and dry.     Comments: Few scattered closed comedones on upper back  Neurological:     General: No focal deficit present.     Mental Status: She is alert. Mental status is at baseline.  Psychiatric:        Mood and Affect: Mood normal.        Behavior: Behavior normal.         Assessment & Plan:   1. Essential hypertension,  benign: Chronic, BP up slightly today but has been out of medications for a few days. Refill HCTZ 25 mg daily. Continue to check BP at home. Due for annual screening labs, CBC, CMP today. Follow up in 6 months for recheck.  - CBC w/Diff/Platelet - COMPLETE METABOLIC PANEL WITH GFR - hydrochlorothiazide (HYDRODIURIL) 25 MG tablet;  Take 1 tablet (25 mg total) by mouth daily.  Dispense: 90 tablet; Refill: 1  2. Hyperlipidemia, unspecified hyperlipidemia type: Continue Lipitor 20 mg. Recheck lipid panel today.   - Lipid Profile - atorvastatin (LIPITOR) 20 MG tablet; Take one tablet by mouth every night at bedtime for cholesterol  Dispense: 30 tablet; Refill: 5  3. Prediabetes: Recheck A1c today.  - HgB A1c  4. 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 Will refill medications but discussed but also discussed potential side effects. Uncertain diagnosis, radicular symptoms and some neuropathy, had x-rays in 2021 but I cannot view these results. No MRI or EMG. Plan to repeat x-rays today may require further imaging based on results.   - DG Lumbar Spine Complete; Future - gabapentin (NEURONTIN) 100 MG capsule; Take 1 capsule (100 mg total) by mouth 3 (three) times daily.  Dispense: 90 capsule; Refill: 3 - meloxicam (MOBIC) 15 MG tablet; Take 1 tablet (15 mg total) by mouth daily.  Dispense: 30 tablet; Refill: 0 - DG Hip Unilat W OR W/O Pelvis 2-3 Views Left; Future  5. Seasonal allergies: Discussed alternating oral anti-histamines, gave a sample of Ryaltris nasal spray today.  6. Acne, unspecified acne type: Had been on oral Doxycycline but will try topical Clindamycin.   - clindamycin (CLINDAGEL) 1 % gel; Apply topically 2 (two) times daily.  Dispense: 30 g; Refill: 0  7. Need for hepatitis C screening test: Screening due.   - Hepatitis C Antibody  8. Encounter for screening mammogram for malignant neoplasm of  breast/Estrogen deficiency/Screening for osteoporosis: Mammogram and bone scan ordered.   - MM DIAG BREAST TOMO BILATERAL; Future - DG Bone Density; Future  9. Vaccine for streptococcus pneumoniae and influenza: Prevnar 20 administered today.   - Pneumococcal conjugate vaccine 20-valent (Prevnar 20)   Return in about 6 months (around 03/31/2022).   Teodora Medici, DO

## 2021-09-30 LAB — COMPLETE METABOLIC PANEL WITH GFR
AST: 12 U/L (ref 10–35)
Albumin: 4.7 g/dL (ref 3.6–5.1)
Alkaline phosphatase (APISO): 80 U/L (ref 37–153)
CO2: 26 mmol/L (ref 20–32)
Calcium: 11.1 mg/dL — ABNORMAL HIGH (ref 8.6–10.4)
Creat: 0.77 mg/dL (ref 0.50–1.05)
Globulin: 3.2 g/dL (calc) (ref 1.9–3.7)
Glucose, Bld: 88 mg/dL (ref 65–99)
Potassium: 4.4 mmol/L (ref 3.5–5.3)

## 2021-09-30 LAB — LIPID PANEL
LDL Cholesterol (Calc): 146 mg/dL (calc) — ABNORMAL HIGH
Non-HDL Cholesterol (Calc): 170 mg/dL (calc) — ABNORMAL HIGH (ref <130)
Total CHOL/HDL Ratio: 4.8 (calc) (ref <5.0)

## 2021-09-30 LAB — HEMOGLOBIN A1C: Mean Plasma Glucose: 117 mg/dL

## 2021-09-30 LAB — CBC WITH DIFFERENTIAL/PLATELET
Basophils Absolute: 28 cells/uL (ref 0–200)
Basophils Relative: 0.3 %
Eosinophils Relative: 0 %
HCT: 45.8 % — ABNORMAL HIGH (ref 35.0–45.0)
Hemoglobin: 15.1 g/dL (ref 11.7–15.5)
Lymphs Abs: 2990 cells/uL (ref 850–3900)
MCHC: 33 g/dL (ref 32.0–36.0)
MCV: 88.4 fL (ref 80.0–100.0)
RBC: 5.18 10*6/uL — ABNORMAL HIGH (ref 3.80–5.10)

## 2021-10-02 ENCOUNTER — Ambulatory Visit: Payer: Self-pay | Admitting: Internal Medicine

## 2021-10-02 LAB — CBC WITH DIFFERENTIAL/PLATELET
Absolute Monocytes: 635 cells/uL (ref 200–950)
Eosinophils Absolute: 0 cells/uL — ABNORMAL LOW (ref 15–500)
MCH: 29.2 pg (ref 27.0–33.0)
MPV: 9.9 fL (ref 7.5–12.5)
Monocytes Relative: 6.9 %
Neutro Abs: 5548 cells/uL (ref 1500–7800)
Neutrophils Relative %: 60.3 %
Platelets: 308 10*3/uL (ref 140–400)
RDW: 12.9 % (ref 11.0–15.0)
Total Lymphocyte: 32.5 %
WBC: 9.2 10*3/uL (ref 3.8–10.8)

## 2021-10-02 LAB — COMPLETE METABOLIC PANEL WITH GFR
AG Ratio: 1.5 (calc) (ref 1.0–2.5)
ALT: 7 U/L (ref 6–29)
BUN: 12 mg/dL (ref 7–25)
Chloride: 103 mmol/L (ref 98–110)
Sodium: 142 mmol/L (ref 135–146)
Total Bilirubin: 0.5 mg/dL (ref 0.2–1.2)
Total Protein: 7.9 g/dL (ref 6.1–8.1)
eGFR: 83 mL/min/{1.73_m2} (ref >=60)

## 2021-10-02 LAB — HEPATITIS C ANTIBODY
Hepatitis C Ab: NONREACTIVE
SIGNAL TO CUT-OFF: 0.12 (ref <1.00)

## 2021-10-02 LAB — LIPID PANEL
Cholesterol: 215 mg/dL — ABNORMAL HIGH (ref <200)
HDL: 45 mg/dL — ABNORMAL LOW (ref >=50)
Triglycerides: 118 mg/dL (ref <150)

## 2021-10-02 LAB — HEMOGLOBIN A1C
Hgb A1c MFr Bld: 5.7 % of total Hgb — ABNORMAL HIGH (ref <5.7)
eAG (mmol/L): 6.5 mmol/L

## 2021-10-04 ENCOUNTER — Encounter: Payer: Self-pay | Admitting: Internal Medicine

## 2021-10-05 ENCOUNTER — Ambulatory Visit
Admission: RE | Admit: 2021-10-05 | Discharge: 2021-10-05 | Disposition: A | Discharge status: Home / Self Care | Payer: Medicare Other | Source: Ambulatory Visit | Attending: Internal Medicine | Admitting: Internal Medicine

## 2021-10-05 ENCOUNTER — Ambulatory Visit
Admission: RE | Admit: 2021-10-05 | Discharge: 2021-10-05 | Disposition: A | Discharge status: Home / Self Care | Payer: Medicare Other | Attending: Internal Medicine | Admitting: Internal Medicine

## 2021-10-05 DIAGNOSIS — M5416 Radiculopathy, lumbar region: Secondary | ICD-10-CM | POA: Diagnosis present

## 2021-10-05 DIAGNOSIS — M25552 Pain in left hip: Secondary | ICD-10-CM | POA: Diagnosis present

## 2021-10-05 DIAGNOSIS — G8929 Other chronic pain: Secondary | ICD-10-CM | POA: Diagnosis present

## 2021-12-19 ENCOUNTER — Ambulatory Visit
Admission: RE | Admit: 2021-12-19 | Discharge: 2021-12-19 | Disposition: A | Discharge status: Home / Self Care | Payer: Medicare Other | Source: Ambulatory Visit | Attending: Internal Medicine | Admitting: Internal Medicine

## 2021-12-19 DIAGNOSIS — Z13820 Encounter for screening for osteoporosis: Secondary | ICD-10-CM

## 2021-12-19 DIAGNOSIS — Z1231 Encounter for screening mammogram for malignant neoplasm of breast: Secondary | ICD-10-CM | POA: Insufficient documentation

## 2021-12-19 DIAGNOSIS — E2839 Other primary ovarian failure: Secondary | ICD-10-CM

## 2022-02-11 ENCOUNTER — Other Ambulatory Visit: Payer: Self-pay | Admitting: Internal Medicine

## 2022-02-11 DIAGNOSIS — G8929 Other chronic pain: Secondary | ICD-10-CM

## 2022-02-12 NOTE — Telephone Encounter (Signed)
Requested Prescriptions  Pending Prescriptions Disp Refills  . gabapentin (NEURONTIN) 100 MG capsule [Pharmacy Med Name: Gabapentin 100 MG Oral Capsule] 270 capsule 2    Sig: TAKE 1 CAPSULE BY MOUTH THREE TIMES DAILY     Neurology: Anticonvulsants - gabapentin Passed - 02/11/2022 11:18 AM      Passed - Cr in normal range and within 360 days    Creat  Date Value Ref Range Status  09/29/2021 0.77 0.50 - 1.05 mg/dL Final         Passed - Completed PHQ-2 or PHQ-9 in the last 360 days      Passed - Valid encounter within last 12 months    Recent Outpatient Visits          4 months ago Essential hypertension, benign   [facility] Teodora Medici, DO      Future Appointments            In 1 month Teodora Medici, [facility]

## 2022-02-12 NOTE — Patient Instructions (Signed)

## 2022-02-12 NOTE — Progress Notes (Unsigned)
I connected with  Lauren Rosario on 02/13/2022 by a audio enabled telemedicine application and verified that I am speaking with the correct person using two identifiers.  Patient Location: Home  Provider Location: Office/Clinic  I discussed the limitations of evaluation and management by telemedicine. The patient expressed understanding and agreed to proceed.   Subjective:   Lauren Rosario is a 71 y.o. female who presents for Medicare Annual (Subsequent) preventive examination.  Review of Systems    Per HPI unless specifically indicated below.      Objective:       09/29/2021    3:14 PM 07/23/2013    7:58 AM 01/22/2013    8:38 AM  Vitals with BMI  Height '5\' 1"'$  '5\' 2"'$    Weight 152 lbs 13 oz 158 lbs 3 oz 158 lbs  BMI 57.32 29   Systolic 202 542 706  Diastolic 88 90 90  Pulse 82 84 72    Today's Vitals   02/13/22 0907  PainSc: 8    There is no height or weight on file to calculate BMI.     02/13/2022    9:38 AM  Advanced Directives  Does Patient Have a Medical Advance Directive? No  Would patient like information on creating a medical advance directive? No - Patient declined    Current Medications (verified) Outpatient Encounter Medications as of 02/13/2022  Medication Sig   atorvastatin (LIPITOR) 20 MG tablet Take one tablet by mouth every night at bedtime for cholesterol (Patient taking differently: Take 40 mg by mouth daily. Take one tablet by mouth every night at bedtime for cholesterol)   cetirizine (ZYRTEC) 10 MG tablet Take 1 tablet (10 mg total) by mouth daily.   clindamycin (CLINDAGEL) 1 % gel Apply topically 2 (two) times daily.   gabapentin (NEURONTIN) 100 MG capsule TAKE 1 CAPSULE BY MOUTH THREE TIMES DAILY   hydrochlorothiazide (HYDRODIURIL) 25 MG tablet Take 1 tablet (25 mg total) by mouth daily.   ibuprofen (ADVIL) 800 MG tablet Take 800 mg by mouth daily.   [DISCONTINUED] doxycycline (VIBRAMYCIN) 100 MG capsule Take 1 capsule (100 mg total) by mouth at  bedtime. (Patient not taking: Reported on 02/13/2022)   [DISCONTINUED] gabapentin (NEURONTIN) 100 MG capsule Take 1 capsule (100 mg total) by mouth 3 (three) times daily.   [DISCONTINUED] meloxicam (MOBIC) 15 MG tablet Take 1 tablet (15 mg total) by mouth daily. (Patient not taking: Reported on 02/13/2022)   No facility-administered encounter medications on file as of 02/13/2022.    Allergies (verified) Latex and Neosporin [neomycin-bacitracin zn-polymyx]   History: Past Medical History:  Diagnosis Date   Allergy    seasonal   Anxiety    Arthritis    Depression    Hyperlipidemia    Hypertension    Neuromuscular disorder ([place])    Tobacco use disorder    Past Surgical History:  Procedure Laterality Date   COLON SURGERY  11/2010   Colonoscopy   DENTAL SURGERY     WRIST SURGERY  09/22/2008   left   Family History  Problem Relation Age of Onset   Stroke Mother    Hernia Brother    Heart disease Son        Congenital Heart disease   Breast cancer Maternal Aunt 80   Social History   Socioeconomic History   Marital status: Divorced    Spouse name: Not on file   Number of children: 2   Years of education: Not on file   Highest  education level: Not on file  Occupational History   Occupation: Retired  Tobacco Use   Smoking status: Every Day    Packs/day: 0.50    Years: 50.00    Total pack years: 25.00    Types: Cigarettes    Start date: 1973   Smokeless tobacco: Never  Vaping Use   Vaping Use: Never used  Substance and Sexual Activity   Alcohol use: Not Currently   Drug use: No   Sexual activity: Not Currently  Other Topics Concern   Not on file  Social History Narrative   Not on file   Social Determinants of Health   Financial Resource Strain: Low Risk  (02/13/2022)   Overall Financial Resource Strain (CARDIA)    Difficulty of Paying Living Expenses: Not hard at all  Food Insecurity: No Food Insecurity (02/13/2022)   Hunger Vital Sign    Worried About  Running Out of Food in the Last Year: Never true    Ran Out of Food in the Last Year: Never true  Transportation Needs: No Transportation Needs (02/13/2022)   PRAPARE - Hydrologist (Medical): No    Lack of Transportation (Non-Medical): No  Physical Activity: Not on file  Stress: No Stress Concern Present (02/13/2022)   [place]    Feeling of Stress : Only a little  Social Connections: Moderately Integrated (02/13/2022)   Social Connection and Isolation Panel [NHANES]    Frequency of Communication with Friends and Family: Twice a week    Frequency of Social Gatherings with Friends and Family: More than three times a week    Attends Religious Services: More than 4 times per year    Active Member of [employer] or Organizations: Yes    Attends Music therapist: More than 4 times per year    Marital Status: Divorced    Tobacco Counseling Ready to quit: Yes Counseling given: No   Clinical Intake:  Pre-visit preparation completed: No  Pain : 0-10 Pain Score: 8  Pain Type: Chronic pain Pain Location: Back (hips) Pain Orientation: Lower, Right, Left Pain Descriptors / Indicators: Aching Pain Frequency: Constant     Nutritional Status: BMI of 19-24  Normal Diabetes: No  How often do you need to have someone help you when you read instructions, pamphlets, or other written materials from your doctor or pharmacy?: 1 - Never  Diabetic?no     Information entered by :: Donnie [place], [place]   Activities of Daily Living    02/13/2022    9:06 AM 09/29/2021    3:19 PM  In your present state of health, do you have any difficulty performing the following activities:  Hearing? 0 0  Vision? 1 1  Difficulty concentrating or making decisions? 0 0  Walking or climbing stairs? 0 0  Dressing or bathing? 0 0  Doing errands, shopping? 0 0    Patient Care Team: Teodora Medici,  DO as PCP - General (Internal Medicine)  Indicate any recent Medical Services you may have received from other than [facility] providers in the past year (date may be approximate).     Assessment:   This is a routine wellness examination for Lauren Rosario.  Hearing/Vision screen Denies any hearing issues. Blurry Vision that is worse at night. The pt state she was told it was because of her Cataract.Annual Eye Exam, [facility].  Dietary issues and exercise activities discussed: Current Exercise Habits: Home exercise routine,  Type of exercise: walking;strength training/weights;treadmill, Time (Minutes): 60, Frequency (Times/Week): 3, Weekly Exercise (Minutes/Week): 180, Intensity: Moderate   Goals Addressed             This Visit's Progress    Stay Active and Independent-Low Back Pain       Why is this important?   Regular activity or exercise is important to managing back pain.  Activity helps to keep your muscles strong.  You will sleep better and feel more relaxed.  You will have more energy and feel less stressed.  If you are not active now, start slowly. Little changes make a big difference.  Rest, but not too much.  Stay as active as you can and listen to your body's signals.         Depression Screen    02/13/2022    9:04 AM 09/29/2021    3:19 PM  PHQ 2/9 Scores  PHQ - 2 Score 2 0  PHQ- 9 Score 5 0    Fall Risk    02/13/2022    9:05 AM 09/29/2021    3:18 PM  Fall Risk   Falls in the past year? 0 0  Number falls in past yr: 0 0  Injury with Fall? 0 0  Risk for fall due to : No Fall Risks   Follow up Falls evaluation completed     [place]:  Any stairs in or around the home? No  If so, are there any without handrails? No  Home free of loose throw rugs in walkways, pet beds, electrical cords, etc? Yes  Adequate lighting in your home to reduce risk of falls? Yes   ASSISTIVE DEVICES UTILIZED TO PREVENT FALLS:  Life alert?  No  Use of a cane, walker or w/c? No  Grab bars in the bathroom? No  Shower chair or bench in shower? No  Elevated toilet seat or a handicapped toilet? Yes   TIMED UP AND GO:  Was the test performed?  unable, virtual appt  .    Cognitive Function:        02/13/2022    9:06 AM  6CIT Screen  What Year? 0 points  What month? 0 points  What time? 0 points  Count back from 20 0 points  Months in reverse 0 points  Repeat phrase 0 points  Total Score 0 points    Immunizations Immunization History  Administered Date(s) Administered   COVID-19, mRNA, vaccine(Comirnaty)12 years and older 02/09/2022   Influenza-Unspecified 01/15/2022   Moderna Sars-Covid-2 Vaccination 04/22/2019, 05/20/2019, 04/13/2020   PNEUMOCOCCAL CONJUGATE-20 09/29/2021   PPD Test 03/20/2021   Pneumococcal Conjugate-13 02/20/2017    TDAP status: Due, Education has been provided regarding the importance of this vaccine. Advised may receive this vaccine at local pharmacy or Health Dept. Aware to provide a copy of the vaccination record if obtained from local pharmacy or Health Dept. Verbalized acceptance and understanding.  Flu Vaccine status: Due, Education has been provided regarding the importance of this vaccine. Advised may receive this vaccine at local pharmacy or Health Dept. Aware to provide a copy of the vaccination record if obtained from local pharmacy or Health Dept. Verbalized acceptance and understanding.  Pneumococcal vaccine status: Up to date  Covid-19 vaccine status: Information provided on how to obtain vaccines.   Qualifies for Shingles Vaccine? Yes   Zostavax completed No   Shingrix Completed?: No.    Education has been provided regarding the importance of this vaccine.  Patient has been advised to call insurance company to determine out of pocket expense if they have not yet received this vaccine. Advised may also receive vaccine at local pharmacy or Health Dept. Verbalized acceptance and  understanding.  Screening Tests Health Maintenance  Topic Date Due   Zoster Vaccines- Shingrix (1 of 2) Never done   Lung Cancer Screening  Never done   COVID-19 Vaccine (4 - Moderna risk series) 06/08/2020   Medicare Annual Wellness (AWV)  02/14/2023   MAMMOGRAM  12/20/2023   COLONOSCOPY (Pts 45-59yr Insurance coverage will need to be confirmed)  12/06/2027   Pneumonia Vaccine 70 Years old  Completed   INFLUENZA VACCINE  Completed   DEXA SCAN  Completed   Hepatitis C Screening  Completed   HPV VACCINES  Aged Out   TETANUS/TDAP  Discontinued    Health Maintenance  Health Maintenance Due  Topic Date Due   Zoster Vaccines- Shingrix (1 of 2) Never done   Lung Cancer Screening  Never done   COVID-19 Vaccine (4 - Moderna risk series) 06/08/2020    Colorectal cancer screening: Type of screening: Colonoscopy. Completed 12/05/2017. Repeat every 10 years  Mammogram status: Completed 12/19/2021. Repeat every year  DEXA scan: 12/19/2021  Lung Cancer Screening: (Low Dose CT Chest recommended if Age 70-80years, 30 pack-year currently smoking OR have quit w/in 15years.) does not qualify.     Additional Screening:  Hepatitis C Screening: does qualify; Completed 09/29/2021  Vision Screening: Recommended annual ophthalmology exams for early detection of glaucoma and other disorders of the eye. Is the patient up to date with their annual eye exam?  Yes  Who is the provider or what is the name of the office in which the patient [facility] If pt is not established with a provider, would they like to be referred to a provider to establish care? No .   Dental Screening: Recommended annual dental exams for proper oral hygiene  Community Resource Referral / Chronic Care Management: CRR required this visit?  No   CCM required this visit?  No      Plan:     I have personally reviewed and noted the following in the patient's chart:   Medical and  social history Use of alcohol, tobacco or illicit drugs  Current medications and supplements including opioid prescriptions. Patient is not currently taking opioid prescriptions. Functional ability and status Nutritional status Physical activity Advanced directives List of other physicians Hospitalizations, surgeries, and ER visits in previous 12 months Vitals Screenings to include cognitive, depression, and falls Referrals and appointments  In addition, I have reviewed and discussed with patient certain preventive protocols, quality metrics, and best practice recommendations. A written personalized care plan for preventive services as well as general preventive health recommendations were provided to patient.     Lauren Rosario, Thank you for taking time to come for your Medicare Wellness Visit. I appreciate your ongoing commitment to your health goals. Please review the following plan we discussed and let me know if I can assist you in the future.   These are the goals we discussed:  Goals      Stay Active and Independent-Low Back Pain     Why is this important?   Regular activity or exercise is important to managing back pain.  Activity helps to keep your muscles strong.  You will sleep better and feel more relaxed.  You will have more energy and feel less stressed.  If you are not active now, start slowly. Little changes make a big difference.  Rest, but not too much.  Stay as active as you can and listen to your body's signals.          This is a list of the screening recommended for you and due dates:  Health Maintenance  Topic Date Due   Zoster (Shingles) Vaccine (1 of 2) Never done   Screening for Lung Cancer  Never done   COVID-19 Vaccine (4 - Moderna risk series) 06/08/2020   Medicare Annual Wellness Visit  02/14/2023   Mammogram  12/20/2023   Colon Cancer Screening  12/06/2027   Pneumonia Vaccine  Completed   Flu Shot  Completed   DEXA scan (bone density  measurement)  Completed   Hepatitis C Screening: USPSTF Recommendation to screen - Ages 18-79 yo.  Completed   HPV Vaccine  Aged Out   Tetanus Vaccine  Discontinued     Wilson Singer, [facility]   02/13/2022   Nurse Notes: Approximately 30 minute Non-Face-To-Face Visit

## 2022-02-13 ENCOUNTER — Ambulatory Visit (INDEPENDENT_AMBULATORY_CARE_PROVIDER_SITE_OTHER): Payer: Medicare Other

## 2022-02-13 DIAGNOSIS — Z0000 Encounter for general adult medical examination without abnormal findings: Secondary | ICD-10-CM | POA: Diagnosis not present

## 2022-02-13 DIAGNOSIS — Z122 Encounter for screening for malignant neoplasm of respiratory organs: Secondary | ICD-10-CM

## 2022-02-14 NOTE — Progress Notes (Deleted)
   Acute Office Visit  Subjective:     Patient ID: Lauren Rosario, female    DOB: 10/17/1950, 71 y.o.   MRN: [id number]  No chief complaint on file.   HPI Patient is in today for arthritis pain.    ROS      Objective:    There were no vitals taken for this visit. {Vitals History (Optional):23777}  Physical Exam  No results found for any visits on 02/15/22.      Assessment & Plan:   Problem List Items Addressed This Visit   None   No orders of the defined types were placed in this encounter.   No follow-ups on file.  Teodora Medici, DO

## 2022-02-15 ENCOUNTER — Encounter: Payer: Self-pay | Admitting: Internal Medicine

## 2022-02-15 ENCOUNTER — Ambulatory Visit (INDEPENDENT_AMBULATORY_CARE_PROVIDER_SITE_OTHER): Payer: Medicare Other | Admitting: Internal Medicine

## 2022-02-15 VITALS — BP 144/84 | HR 84 | Temp 99.0°F | Resp 18 | Ht 61.0 in | Wt 153.2 lb

## 2022-02-15 DIAGNOSIS — I10 Essential (primary) hypertension: Secondary | ICD-10-CM

## 2022-02-15 DIAGNOSIS — M1990 Unspecified osteoarthritis, unspecified site: Secondary | ICD-10-CM | POA: Diagnosis not present

## 2022-02-15 DIAGNOSIS — E785 Hyperlipidemia, unspecified: Secondary | ICD-10-CM | POA: Diagnosis not present

## 2022-02-15 MED ORDER — ATORVASTATIN CALCIUM 40 MG PO TABS: 40.0000 mg | ORAL_TABLET | Freq: Every day | ORAL | 3 refills | Status: DC

## 2022-02-15 MED ORDER — ATORVASTATIN CALCIUM 20 MG PO TABS: 40.0000 mg | ORAL_TABLET | Freq: Every day | ORAL | 1 refills | Status: DC

## 2022-02-15 MED ORDER — IBUPROFEN 800 MG PO TABS: 800.0000 mg | ORAL_TABLET | Freq: Every day | ORAL | 1 refills | Status: DC | PRN

## 2022-02-15 MED ORDER — HYDROCHLOROTHIAZIDE 25 MG PO TABS: 25.0000 mg | ORAL_TABLET | Freq: Every day | ORAL | 1 refills | Status: DC

## 2022-02-15 NOTE — Progress Notes (Signed)
Established Patient Office Visit  Subjective    Patient ID: Lauren Rosario, female    DOB: [birth year]  Age: 71 y.o. MRN: [id number]  CC:  Chief Complaint  Patient presents with   Arthritis    HPI Lauren Rosario presents to follow up and discuss arthritis.   Hypertension: -Medications: HCTZ 25 mg -Patient is compliant with above medications and reports no side effects. -Checking BP at home (average): 130/70 -Denies any SOB, CP, vision changes, LE edema or symptoms of hypotension -Exercise: starting to walk a few days a week   HLD: -Medications: Lipitor 20 mg  -Patient is compliant with above medications and reports no side effects.  -Last lipid panel: Lipid Panel     Component Value Date/Time   CHOL 215 (H) 09/29/2021 1612   CHOL 190 07/20/2013 0946   TRIG 118 09/29/2021 1612   HDL 45 (L) 09/29/2021 1612   HDL 49 07/20/2013 0946   CHOLHDL 4.8 09/29/2021 1612   LDLCALC 146 (H) 09/29/2021 1612   LABVLDL 13 07/20/2013 0946   Radicular Lumbar Pain/Neuropathy/Left Hip Pain: -Currently on Gabapentin 100 TID and Mobic 15 mg daily but doesn't like either and wants to be off of both. States the Mobic doesn't do anything for her pain and she wants to be on Ibuprofen 800 mg PRN. Doesn't like the Gabapentin makes her feel "high" and unsteady on her feet.  -Had done PT in past, finished.   Seasonal Allergies: -Currently on Zyrtec 10 mg daily, has chronic sinuitis issues  Pre-Diabetes: -A1c 7/22 6.1%, 3/23 5.2%  Acne: -On side of face and back - had been on oral Doxycycline for this in the past   Health Maintenance: -Blood work UTD -Mammogram: 9/23 - [place] 1 -Colon cancer screening: colonoscopy in the last 10 years, obtaining records -Prevnar 20 UTD   Outpatient Encounter Medications as of 02/15/2022  Medication Sig   cetirizine (ZYRTEC) 10 MG tablet Take 1 tablet (10 mg total) by mouth daily.   clindamycin (CLINDAGEL) 1 % gel Apply topically 2 (two) times daily.    [DISCONTINUED] atorvastatin (LIPITOR) 20 MG tablet Take one tablet by mouth every night at bedtime for cholesterol (Patient taking differently: Take 40 mg by mouth daily. Take one tablet by mouth every night at bedtime for cholesterol)   [DISCONTINUED] gabapentin (NEURONTIN) 100 MG capsule TAKE 1 CAPSULE BY MOUTH THREE TIMES DAILY   [DISCONTINUED] hydrochlorothiazide (HYDRODIURIL) 25 MG tablet Take 1 tablet (25 mg total) by mouth daily.   [DISCONTINUED] ibuprofen (ADVIL) 800 MG tablet Take 800 mg by mouth daily.   atorvastatin (LIPITOR) 20 MG tablet Take 2 tablets (40 mg total) by mouth daily. Take one tablet by mouth every night at bedtime for cholesterol   hydrochlorothiazide (HYDRODIURIL) 25 MG tablet Take 1 tablet (25 mg total) by mouth daily.   ibuprofen (ADVIL) 800 MG tablet Take 1 tablet (800 mg total) by mouth daily as needed for moderate pain.   No facility-administered encounter medications on file as of 02/15/2022.    Past Medical History:  Diagnosis Date   Allergy    seasonal   Anxiety    Arthritis    Depression    Hyperlipidemia    Hypertension    Neuromuscular disorder ([place])    Tobacco use disorder     Past Surgical History:  Procedure Laterality Date   COLON SURGERY  11/2010   Colonoscopy   DENTAL SURGERY     WRIST SURGERY  09/22/2008   left    Family  History  Problem Relation Age of Onset   Stroke Mother    Hernia Brother    Heart disease Son        Congenital Heart disease   Breast cancer Maternal Aunt 80    Social History   Socioeconomic History   Marital status: Divorced    Spouse name: Not on file   Number of children: 2   Years of education: Not on file   Highest education level: Not on file  Occupational History   Occupation: Retired  Tobacco Use   Smoking status: Every Day    Packs/day: 0.50    Years: 50.00    Total pack years: 25.00    Types: Cigarettes    Start date: 1973   Smokeless tobacco: Never  Vaping Use   Vaping Use: Never used   Substance and Sexual Activity   Alcohol use: Not Currently   Drug use: No   Sexual activity: Not Currently  Other Topics Concern   Not on file  Social History Narrative   Not on file   Social Determinants of Health   Financial Resource Strain: Low Risk  (02/13/2022)   Overall Financial Resource Strain (CARDIA)    Difficulty of Paying Living Expenses: Not hard at all  Food Insecurity: No Food Insecurity (02/13/2022)   Hunger Vital Sign    Worried About Running Out of Food in the Last Year: Never true    [place] in the Last Year: Never true  Transportation Needs: No Transportation Needs (02/13/2022)   PRAPARE - Hydrologist (Medical): No    Lack of Transportation (Non-Medical): No  Physical Activity: Not on file  Stress: No Stress Concern Present (02/13/2022)   [place]    Feeling of Stress : Only a little  Social Connections: Moderately Integrated (02/13/2022)   Social Connection and Isolation Panel [NHANES]    Frequency of Communication with Friends and Family: Twice a week    Frequency of Social Gatherings with Friends and Family: More than three times a week    Attends Religious Services: More than 4 times per year    Active Member of [employer] or Organizations: Yes    Attends Music therapist: More than 4 times per year    Marital Status: Divorced  Intimate Partner Violence: Not At Risk (02/13/2022)   Humiliation, Afraid, Rape, and Kick questionnaire    Fear of Current or Ex-Partner: No    Emotionally Abused: No    Physically Abused: No    Sexually Abused: No    Review of Systems  Constitutional:  Negative for chills and fever.  Eyes:  Negative for blurred vision (worsening cataracts).  Respiratory:  Negative for shortness of breath.   Cardiovascular:  Negative for chest pain.  Gastrointestinal:  Negative for abdominal pain, blood in stool, diarrhea,  melena, nausea and vomiting.  Musculoskeletal:  Positive for arthritis, back pain and joint pain.      Objective    BP (!) 144/84   Pulse 84   Temp 99 F (37.2 C)   Resp 18   Ht '5\' 1"'$  (1.549 m)   Wt 153 lb 3.2 oz (69.5 kg)   SpO2 96%   BMI 28.95 kg/m   Physical Exam Constitutional:      Appearance: Normal appearance.  HENT:     Head: Normocephalic and atraumatic.     Nose: Nose normal.     Mouth/Throat:  Mouth: Mucous membranes are moist.     Pharynx: Oropharynx is clear.  Eyes:     Extraocular Movements: Extraocular movements intact.     Conjunctiva/sclera: Conjunctivae normal.     Pupils: Pupils are equal, round, and reactive to light.  Cardiovascular:     Rate and Rhythm: Normal rate and regular rhythm.  Pulmonary:     Effort: Pulmonary effort is normal.     Breath sounds: Normal breath sounds.  Musculoskeletal:     Right lower leg: No edema.     Left lower leg: No edema.  Lymphadenopathy:     Cervical: No cervical adenopathy.  Skin:    General: Skin is warm and dry.     Comments: Few scattered closed comedones on upper back  Neurological:     General: No focal deficit present.     Mental Status: She is alert. Mental status is at baseline.  Psychiatric:        Mood and Affect: Mood normal.        Behavior: Behavior normal.      Assessment & Plan:   1. Arthritis: Discontinue gabapentin and Mobic because patient states his medications are not improving her symptoms.  Will prescribe ibuprofen 800 mg to take as needed.  Patient was counseled on potential side effects such as chronic kidney disease ulcer disease.  Patient voices understanding and agreement. Follow up in 6 months.   - ibuprofen (ADVIL) 800 MG tablet; Take 1 tablet (800 mg total) by mouth daily as needed for moderate pain.  Dispense: 90 tablet; Refill: 1  2. Essential hypertension, benign: Chronic.  Blood pressure borderline today, however patient states she is in pain due to her arthritis.   Continue HCTZ 25 mg, refilled today.  - hydrochlorothiazide (HYDRODIURIL) 25 MG tablet; Take 1 tablet (25 mg total) by mouth daily.  Dispense: 90 tablet; Refill: 1  3. Hyperlipidemia, unspecified hyperlipidemia type: Chronic.  Continue Lipitor 40 mg daily, increased since last lipid panel. Plan to recheck at follow up in 6 months.   - atorvastatin (LIPITOR) 40 MG tablet; Take 1 tablet (40 mg total) by mouth daily.  Dispense: 90 tablet; Refill: 3   Return in about 6 months (around 08/16/2022) for can cancel 12/23 appointment .   Teodora Medici, DO

## 2022-02-15 NOTE — Patient Instructions (Addendum)
It was great seeing you today!  Plan discussed at today's visit: -Medications refilled today -Plan for fasting labs at follow up   Follow up in: 6 months   Take care and let us know if you have any questions or concerns prior to your next visit.  Dr. Rosana Berger

## 2022-04-02 ENCOUNTER — Ambulatory Visit: Payer: Medicare Other | Admitting: Internal Medicine

## 2022-05-17 DIAGNOSIS — H2513 Age-related nuclear cataract, bilateral: Secondary | ICD-10-CM | POA: Diagnosis not present

## 2022-05-23 DIAGNOSIS — H2511 Age-related nuclear cataract, right eye: Secondary | ICD-10-CM | POA: Diagnosis not present

## 2022-05-31 NOTE — Discharge Instructions (Signed)

## 2022-06-01 ENCOUNTER — Encounter: Payer: Self-pay | Admitting: Ophthalmology

## 2022-06-04 ENCOUNTER — Ambulatory Visit: Payer: Medicare Other | Admitting: Anesthesiology

## 2022-06-04 ENCOUNTER — Other Ambulatory Visit: Payer: Self-pay

## 2022-06-04 ENCOUNTER — Encounter: Payer: Self-pay | Admitting: Ophthalmology

## 2022-06-04 ENCOUNTER — Ambulatory Visit
Admission: RE | Admit: 2022-06-04 | Discharge: 2022-06-04 | Disposition: A | Discharge status: Home / Self Care | Payer: Medicare Other | Source: Ambulatory Visit | Attending: Ophthalmology | Admitting: Ophthalmology

## 2022-06-04 ENCOUNTER — Encounter
Admission: RE | Disposition: A | Discharge status: Home / Self Care | Payer: Self-pay | Source: Ambulatory Visit | Attending: Ophthalmology

## 2022-06-04 DIAGNOSIS — H2511 Age-related nuclear cataract, right eye: Secondary | ICD-10-CM | POA: Diagnosis not present

## 2022-06-04 DIAGNOSIS — E785 Hyperlipidemia, unspecified: Secondary | ICD-10-CM | POA: Insufficient documentation

## 2022-06-04 DIAGNOSIS — I10 Essential (primary) hypertension: Secondary | ICD-10-CM | POA: Insufficient documentation

## 2022-06-04 DIAGNOSIS — J302 Other seasonal allergic rhinitis: Secondary | ICD-10-CM | POA: Insufficient documentation

## 2022-06-04 DIAGNOSIS — F17210 Nicotine dependence, cigarettes, uncomplicated: Secondary | ICD-10-CM | POA: Insufficient documentation

## 2022-06-04 HISTORY — PX: CATARACT EXTRACTION W/PHACO: SHX586

## 2022-06-04 SURGERY — PHACOEMULSIFICATION, CATARACT, WITH IOL INSERTION
Anesthesia: Monitor Anesthesia Care | Site: Eye | Laterality: Right

## 2022-06-04 MED ORDER — MOXIFLOXACIN HCL 0.5 % OP SOLN
OPHTHALMIC | Status: DC | PRN
  Administered 2022-06-04: .2 mL via OPHTHALMIC

## 2022-06-04 MED ORDER — MIDAZOLAM HCL 2 MG/2ML IJ SOLN
INTRAMUSCULAR | Status: DC | PRN
  Administered 2022-06-04: 1 mg via INTRAVENOUS

## 2022-06-04 MED ORDER — SODIUM CHLORIDE 0.9% FLUSH
INTRAVENOUS | Status: DC | PRN
  Administered 2022-06-04: 10 mL via INTRAVENOUS

## 2022-06-04 MED ORDER — LIDOCAINE HCL (PF) 2 % IJ SOLN
INTRAOCULAR | Status: DC | PRN
  Administered 2022-06-04: 1 mL via INTRAOCULAR

## 2022-06-04 MED ORDER — ARMC OPHTHALMIC DILATING DROPS
1.0000 | OPHTHALMIC | Status: DC | PRN
  Administered 2022-06-04 (×3): 1 via OPHTHALMIC

## 2022-06-04 MED ORDER — SIGHTPATH DOSE#1 NA HYALUR & NA CHOND-NA HYALUR IO KIT
PACK | INTRAOCULAR | Status: DC | PRN
  Administered 2022-06-04: 1 via OPHTHALMIC

## 2022-06-04 MED ORDER — SIGHTPATH DOSE#1 BSS IO SOLN
INTRAOCULAR | Status: DC | PRN
  Administered 2022-06-04: 90 mL via OPHTHALMIC

## 2022-06-04 MED ORDER — FENTANYL CITRATE (PF) 100 MCG/2ML IJ SOLN
INTRAMUSCULAR | Status: DC | PRN
  Administered 2022-06-04: 50 ug via INTRAVENOUS

## 2022-06-04 MED ORDER — SIGHTPATH DOSE#1 BSS IO SOLN
INTRAOCULAR | Status: DC | PRN
  Administered 2022-06-04: 15 mL

## 2022-06-04 MED ORDER — TETRACAINE HCL 0.5 % OP SOLN
1.0000 [drp] | OPHTHALMIC | Status: DC | PRN
Start: 2022-06-04 — End: 2022-06-04
  Administered 2022-06-04 (×3): 1 [drp] via OPHTHALMIC

## 2022-06-04 SURGICAL SUPPLY — 13 items
CATARACT SUITE SIGHTPATH (MISCELLANEOUS) ×1 IMPLANT
DISSECTOR HYDRO NUCLEUS 50X22 (MISCELLANEOUS) ×1 IMPLANT
FEE CATARACT SUITE SIGHTPATH (MISCELLANEOUS) ×1 IMPLANT
GLOVE SURG GAMMEX PI TX LF 7.5 (GLOVE) ×1 IMPLANT
GLOVE SURG SYN 8.5  E (GLOVE) ×1
GLOVE SURG SYN 8.5 E (GLOVE) ×1 IMPLANT
GLOVE SURG SYN 8.5 PF PI (GLOVE) ×1 IMPLANT
LENS IOL TECNIS EYHANCE 23.0 (Intraocular Lens) IMPLANT
NDL FILTER BLUNT 18X1 1/2 (NEEDLE) ×1 IMPLANT
NEEDLE FILTER BLUNT 18X1 1/2 (NEEDLE) ×1 IMPLANT
SYR 3ML LL SCALE MARK (SYRINGE) ×1 IMPLANT
SYR 5ML LL (SYRINGE) ×1 IMPLANT
WATER STERILE IRR 250ML POUR (IV SOLUTION) ×1 IMPLANT

## 2022-06-04 NOTE — Addendum Note (Signed)
Addendum  created 06/04/22 1356 by Iran Ouch, MD   Attestation recorded in [place], [place] filed

## 2022-06-04 NOTE — Op Note (Signed)
OPERATIVE NOTE  Cera Pfost [id number] 06/04/2022   PREOPERATIVE DIAGNOSIS:  Nuclear sclerotic cataract right eye.  H25.11   POSTOPERATIVE DIAGNOSIS:    Nuclear sclerotic cataract right eye.     PROCEDURE:  Phacoemusification with posterior chamber intraocular lens placement of the right eye   LENS:   Implant Name Type Inv. Item Serial No. Manufacturer Lot No. LRB No. Used Action  LENS IOL TECNIS EYHANCE 23.0 - [id number] Intraocular Lens LENS IOL TECNIS EYHANCE 23.0 [id number] [employer]  Right 1 Implanted       Procedure(s): CATARACT EXTRACTION PHACO AND INTRAOCULAR LENS PLACEMENT (IOC) RIGHT  2.94  00:28.8 (Right)  DIB00 +23.0   ULTRASOUND TIME: 0 minutes 28 seconds.  CDE 2.94   SURGEON:  Benay Pillow, MD, MPH  ANESTHESIOLOGIST: Anesthesiologist: [place] Ouch, MD CRNA: Jacqualin Combes, CRNA; Hilbert Odor, CRNA   ANESTHESIA:  Topical with tetracaine drops augmented with 1% preservative-free intracameral lidocaine.  ESTIMATED BLOOD LOSS: less than 1 mL.   COMPLICATIONS:  None.   DESCRIPTION OF PROCEDURE:  The patient was identified in the holding room and transported to the operating room and placed in the supine position under the operating microscope.  The right eye was identified as the operative eye and it was prepped and draped in the usual sterile ophthalmic fashion.   A 1.0 millimeter clear-corneal paracentesis was made at the 10:30 position. 0.5 ml of preservative-free 1% lidocaine with epinephrine was injected into the anterior chamber.  The anterior chamber was filled with viscoelastic.  A 2.4 millimeter keratome was used to make a near-clear corneal incision at the 8:00 position.  A curvilinear capsulorrhexis was made with a cystotome and capsulorrhexis forceps.  Balanced salt solution was used to hydrodissect and hydrodelineate the nucleus.   Phacoemulsification was then used in stop and chop fashion to remove the lens nucleus and epinucleus.  The  remaining cortex was then removed using the irrigation and aspiration handpiece. Viscoelastic was then placed into the capsular bag to distend it for lens placement.  A lens was then injected into the capsular bag.  The remaining viscoelastic was aspirated.   Wounds were hydrated with balanced salt solution.  The anterior chamber was inflated to a physiologic pressure with balanced salt solution.   Intracameral vigamox 0.1 mL undiluted was injected into the eye and a drop placed onto the ocular surface.  No wound leaks were noted.  The patient was taken to the recovery room in stable condition without complications of anesthesia or surgery  Benay Pillow 06/04/2022, 1:37 PM

## 2022-06-04 NOTE — H&P (Signed)
[facility]   Primary Care Physician:  Teodora Medici, DO Ophthalmologist: Dr. Benay Pillow  Pre-Procedure History & Physical: HPI:  Lauren Rosario is a 72 y.o. female here for cataract surgery.   Past Medical History:  Diagnosis Date   Allergy    seasonal   Anxiety    Arthritis    Depression    Hyperlipidemia    Hypertension    Neuromuscular disorder ([place])    Tobacco use disorder     Past Surgical History:  Procedure Laterality Date   COLON SURGERY  11/2010   Colonoscopy   DENTAL SURGERY     WRIST SURGERY  09/22/2008   left    Prior to Admission medications   Medication Sig Start Date End Date Taking? Authorizing Provider  atorvastatin (LIPITOR) 40 MG tablet Take 1 tablet (40 mg total) by mouth daily. 02/15/22  Yes Teodora Medici, DO  cetirizine (ZYRTEC) 10 MG tablet Take 1 tablet (10 mg total) by mouth daily. 07/23/13  Yes Reed, Tiffany L, DO  clindamycin (CLINDAGEL) 1 % gel Apply topically 2 (two) times daily. 09/29/21  Yes Teodora Medici, DO  hydrochlorothiazide (HYDRODIURIL) 25 MG tablet Take 1 tablet (25 mg total) by mouth daily. 02/15/22  Yes Teodora Medici, DO  ibuprofen (ADVIL) 800 MG tablet Take 1 tablet (800 mg total) by mouth daily as needed for moderate pain. 02/15/22  Yes Teodora Medici, DO    Allergies as of 05/23/2022 - Review Complete 02/15/2022  Allergen Reaction Noted   Latex Other (See Comments) 12/21/2010   Neosporin [neomycin-bacitracin zn-polymyx] Rash 12/21/2010    Family History  Problem Relation Age of Onset   Stroke Mother    Hernia Brother    Heart disease Son        Congenital Heart disease   Breast cancer Maternal Aunt 80    Social History   Socioeconomic History   Marital status: Divorced    Spouse name: Not on file   Number of children: 2   Years of education: Not on file   Highest education level: Not on file  Occupational History   Occupation: Retired  Tobacco Use   Smoking status: Every Day     Packs/day: 0.50    Years: 50.00    Total pack years: 25.00    Types: Cigarettes    Start date: 1973   Smokeless tobacco: Never  Vaping Use   Vaping Use: Never used  Substance and Sexual Activity   Alcohol use: Not Currently   Drug use: No   Sexual activity: Not Currently  Other Topics Concern   Not on file  Social History Narrative   Not on file   Social Determinants of Health   Financial Resource Strain: Low Risk  (02/13/2022)   Overall Financial Resource Strain (CARDIA)    Difficulty of Paying Living Expenses: Not hard at all  Food Insecurity: No Food Insecurity (02/13/2022)   Hunger Vital Sign    Worried About Running Out of Food in the Last Year: Never true    [place] in the Last Year: Never true  Transportation Needs: No Transportation Needs (02/13/2022)   PRAPARE - Hydrologist (Medical): No    Lack of Transportation (Non-Medical): No  Physical Activity: Not on file  Stress: No Stress Concern Present (02/13/2022)   [place]    Feeling of Stress : Only a little  Social Connections: Moderately Integrated (02/13/2022)  Social Licensed conveyancer [NHANES]    Frequency of Communication with Friends and Family: Twice a week    Frequency of Social Gatherings with Friends and Family: More than three times a week    Attends Religious Services: More than 4 times per year    Active Member of [employer] or Organizations: Yes    Attends Music therapist: More than 4 times per year    Marital Status: Divorced  Intimate Partner Violence: Not At Risk (02/13/2022)   Humiliation, Afraid, Rape, and Kick questionnaire    Fear of Current or Ex-Partner: No    Emotionally Abused: No    Physically Abused: No    Sexually Abused: No    Review of Systems: See HPI, otherwise negative ROS  Physical Exam: BP (!) 152/96   Pulse 71   Temp 98.6 F (37 C) (Temporal)    Resp 16   Ht 5' 1"$  (1.549 m)   Wt 67.1 kg   SpO2 97%   BMI 27.96 kg/m  General:   Alert, cooperative in NAD Head:  Normocephalic and atraumatic. Respiratory:  Normal work of breathing. Cardiovascular:  RRR  Impression/Plan: Lauren Rosario is here for cataract surgery.  Risks, benefits, limitations, and alternatives regarding cataract surgery have been reviewed with the patient.  Questions have been answered.  All parties agreeable.   Benay Pillow, MD  06/04/2022, 1:09 PM

## 2022-06-04 NOTE — Transfer of Care (Signed)
Immediate Anesthesia Transfer of Care Note  Patient: Lauren Rosario  Procedure(s) Performed: CATARACT EXTRACTION PHACO AND INTRAOCULAR LENS PLACEMENT (IOC) RIGHT  2.94  00:28.8 (Right: Eye)  Patient Location: PACU  Anesthesia Type: MAC  Level of Consciousness: awake, alert  and patient cooperative  Airway and Oxygen Therapy: Patient Spontanous Breathing and Patient connected to supplemental oxygen  Post-op Assessment: Post-op Vital signs reviewed, Patient's Cardiovascular Status Stable, Respiratory Function Stable, Patent Airway and No signs of Nausea or vomiting  Post-op Vital Signs: Reviewed and stable  Complications: No notable events documented.

## 2022-06-04 NOTE — Anesthesia Preprocedure Evaluation (Addendum)
Anesthesia Evaluation  Patient identified by MRN, date of birth, ID band Patient awake    Reviewed: Allergy & Precautions, H&P , NPO status , Patient's Chart, lab work & pertinent test results  Airway Mallampati: III  TM Distance: >3 FB Neck ROM: full    Dental no notable dental hx.    Pulmonary Current SmokerPatient did not abstain from smoking.   Pulmonary exam normal        Cardiovascular hypertension, Normal cardiovascular exam     Neuro/Psych  PSYCHIATRIC DISORDERS Anxiety Depression    negative neurological ROS     GI/Hepatic negative GI ROS, Neg liver ROS,,,  Endo/Other  negative endocrine ROS    Renal/GU      Musculoskeletal  (+) Arthritis ,    Abdominal Normal abdominal exam  (+)   Peds  Hematology negative hematology ROS (+)   Anesthesia Other Findings Past Medical History: No date: Allergy     Comment:  seasonal No date: Anxiety No date: Arthritis No date: Depression No date: Hyperlipidemia No date: Hypertension No date: Neuromuscular disorder ([place]) No date: Tobacco use disorder  Past Surgical History: 11/2010: COLON SURGERY     Comment:  Colonoscopy No date: DENTAL SURGERY 09/22/2008: WRIST SURGERY     Comment:  left  BMI    Body Mass Index: 28.72 kg/m      Reproductive/Obstetrics negative OB ROS                             Anesthesia Physical Anesthesia Plan  ASA: 2  Anesthesia Plan: MAC   Post-op Pain Management:    Induction:   PONV Risk Score and Plan:   Airway Management Planned: Natural Airway  Additional Equipment:   Intra-op Plan:   Post-operative Plan:   Informed Consent: I have reviewed the patients History and Physical, chart, labs and discussed the procedure including the risks, benefits and alternatives for the proposed anesthesia with the patient or authorized representative who has indicated his/her understanding and acceptance.        Plan Discussed with: Anesthesiologist, CRNA and Surgeon  Anesthesia Plan Comments:         Anesthesia Quick Evaluation

## 2022-06-04 NOTE — Anesthesia Procedure Notes (Signed)
Procedure Name: MAC Date/Time: 06/04/2022 1:20 PM  Performed by: Hilbert Odor, CRNAPre-anesthesia Checklist: Patient identified, Emergency Drugs available, Suction available and Patient being monitored Patient Re-evaluated:Patient Re-evaluated prior to induction Oxygen Delivery Method: Nasal cannula

## 2022-06-04 NOTE — Anesthesia Postprocedure Evaluation (Signed)
Anesthesia Post Note  Patient: Lauren Rosario  Procedure(s) Performed: CATARACT EXTRACTION PHACO AND INTRAOCULAR LENS PLACEMENT (IOC) RIGHT  2.94  00:28.8 (Right: Eye)  Patient location during evaluation: PACU Anesthesia Type: MAC Level of consciousness: awake and alert Pain management: pain level controlled Vital Signs Assessment: post-procedure vital signs reviewed and stable Respiratory status: spontaneous breathing, nonlabored ventilation and respiratory function stable Cardiovascular status: blood pressure returned to baseline and stable Postop Assessment: no apparent nausea or vomiting Anesthetic complications: no   No notable events documented.   Last Vitals:  Vitals:   06/04/22 1338 06/04/22 1343  BP: (!) 163/95 (!) 151/86  Pulse: 81 79  Resp: 10 14  Temp: (!) 36.1 C (!) 36.1 C  SpO2: 98% 99%    Last Pain:  Vitals:   06/04/22 1343  TempSrc:   PainSc: 0-No pain                 Iran Ouch

## 2022-06-05 ENCOUNTER — Encounter: Payer: Self-pay | Admitting: Ophthalmology

## 2022-06-05 DIAGNOSIS — H2512 Age-related nuclear cataract, left eye: Secondary | ICD-10-CM | POA: Diagnosis not present

## 2022-06-21 NOTE — Discharge Instructions (Signed)

## 2022-06-25 ENCOUNTER — Ambulatory Visit
Admission: RE | Admit: 2022-06-25 | Discharge: 2022-06-25 | Disposition: A | Discharge status: Home / Self Care | Payer: Medicare Other | Source: Ambulatory Visit | Attending: Ophthalmology | Admitting: Ophthalmology

## 2022-06-25 ENCOUNTER — Ambulatory Visit: Payer: Medicare Other | Admitting: Anesthesiology

## 2022-06-25 ENCOUNTER — Other Ambulatory Visit: Payer: Self-pay

## 2022-06-25 ENCOUNTER — Encounter
Admission: RE | Disposition: A | Discharge status: Home / Self Care | Payer: Self-pay | Source: Ambulatory Visit | Attending: Ophthalmology

## 2022-06-25 ENCOUNTER — Encounter: Payer: Self-pay | Admitting: Ophthalmology

## 2022-06-25 DIAGNOSIS — F32A Depression, unspecified: Secondary | ICD-10-CM | POA: Diagnosis not present

## 2022-06-25 DIAGNOSIS — E785 Hyperlipidemia, unspecified: Secondary | ICD-10-CM | POA: Diagnosis not present

## 2022-06-25 DIAGNOSIS — H2512 Age-related nuclear cataract, left eye: Secondary | ICD-10-CM | POA: Insufficient documentation

## 2022-06-25 DIAGNOSIS — F17210 Nicotine dependence, cigarettes, uncomplicated: Secondary | ICD-10-CM | POA: Insufficient documentation

## 2022-06-25 DIAGNOSIS — Z79899 Other long term (current) drug therapy: Secondary | ICD-10-CM | POA: Diagnosis not present

## 2022-06-25 DIAGNOSIS — M1990 Unspecified osteoarthritis, unspecified site: Secondary | ICD-10-CM | POA: Diagnosis not present

## 2022-06-25 DIAGNOSIS — F419 Anxiety disorder, unspecified: Secondary | ICD-10-CM | POA: Insufficient documentation

## 2022-06-25 DIAGNOSIS — I10 Essential (primary) hypertension: Secondary | ICD-10-CM | POA: Diagnosis not present

## 2022-06-25 HISTORY — PX: CATARACT EXTRACTION W/PHACO: SHX586

## 2022-06-25 SURGERY — PHACOEMULSIFICATION, CATARACT, WITH IOL INSERTION
Anesthesia: Monitor Anesthesia Care | Site: Eye | Laterality: Left

## 2022-06-25 MED ORDER — SIGHTPATH DOSE#1 BSS IO SOLN
INTRAOCULAR | Status: DC | PRN
  Administered 2022-06-25: 83 mL via OPHTHALMIC

## 2022-06-25 MED ORDER — MOXIFLOXACIN HCL 0.5 % OP SOLN
OPHTHALMIC | Status: DC | PRN
  Administered 2022-06-25: .2 mL via OPHTHALMIC

## 2022-06-25 MED ORDER — FENTANYL CITRATE (PF) 100 MCG/2ML IJ SOLN
INTRAMUSCULAR | Status: DC | PRN
  Administered 2022-06-25 (×2): 50 ug via INTRAVENOUS

## 2022-06-25 MED ORDER — TETRACAINE HCL 0.5 % OP SOLN
1.0000 [drp] | OPHTHALMIC | Status: DC | PRN
  Administered 2022-06-25 (×3): 1 [drp] via OPHTHALMIC

## 2022-06-25 MED ORDER — SIGHTPATH DOSE#1 BSS IO SOLN
INTRAOCULAR | Status: DC | PRN
  Administered 2022-06-25: 15 mL

## 2022-06-25 MED ORDER — LIDOCAINE HCL (PF) 2 % IJ SOLN
INTRAOCULAR | Status: DC | PRN
  Administered 2022-06-25: 1 mL via INTRAOCULAR

## 2022-06-25 MED ORDER — MIDAZOLAM HCL 2 MG/2ML IJ SOLN
INTRAMUSCULAR | Status: DC | PRN
  Administered 2022-06-25: 1 mg via INTRAVENOUS

## 2022-06-25 MED ORDER — ARMC OPHTHALMIC DILATING DROPS
1.0000 | OPHTHALMIC | Status: DC | PRN
  Administered 2022-06-25 (×3): 1 via OPHTHALMIC

## 2022-06-25 MED ORDER — SIGHTPATH DOSE#1 NA HYALUR & NA CHOND-NA HYALUR IO KIT
PACK | INTRAOCULAR | Status: DC | PRN
  Administered 2022-06-25: 1 via OPHTHALMIC

## 2022-06-25 SURGICAL SUPPLY — 13 items
CATARACT SUITE SIGHTPATH (MISCELLANEOUS) ×1 IMPLANT
DISSECTOR HYDRO NUCLEUS 50X22 (MISCELLANEOUS) ×1 IMPLANT
FEE CATARACT SUITE SIGHTPATH (MISCELLANEOUS) ×1 IMPLANT
GLOVE SURG GAMMEX PI TX LF 7.5 (GLOVE) ×1 IMPLANT
GLOVE SURG SYN 8.5  E (GLOVE) ×1
GLOVE SURG SYN 8.5 E (GLOVE) ×1 IMPLANT
GLOVE SURG SYN 8.5 PF PI (GLOVE) ×1 IMPLANT
LENS IOL TECNIS EYHANCE 22.5 (Intraocular Lens) IMPLANT
NDL FILTER BLUNT 18X1 1/2 (NEEDLE) ×1 IMPLANT
NEEDLE FILTER BLUNT 18X1 1/2 (NEEDLE) ×1 IMPLANT
SYR 3ML LL SCALE MARK (SYRINGE) ×1 IMPLANT
SYR 5ML LL (SYRINGE) ×1 IMPLANT
WATER STERILE IRR 250ML POUR (IV SOLUTION) ×1 IMPLANT

## 2022-06-25 NOTE — Anesthesia Postprocedure Evaluation (Signed)
Anesthesia Post Note  Patient: Lauren Rosario  Procedure(s) Performed: CATARACT EXTRACTION PHACO AND INTRAOCULAR LENS PLACEMENT (IOC) LEFT  4.40  00:33.2 (Left: Eye)  Patient location during evaluation: PACU Anesthesia Type: MAC Level of consciousness: awake and alert Pain management: pain level controlled Vital Signs Assessment: post-procedure vital signs reviewed and stable Respiratory status: spontaneous breathing, nonlabored ventilation and respiratory function stable Cardiovascular status: blood pressure returned to baseline and stable Postop Assessment: no apparent nausea or vomiting Anesthetic complications: no   No notable events documented.   Last Vitals:  Vitals:   06/25/22 1146 06/25/22 1153  BP: 136/86 132/84  Pulse: 71 74  Resp: 12 12  Temp: (!) 36.1 C (!) 36.2 C  SpO2: 98% 97%    Last Pain:  Vitals:   06/25/22 1153  TempSrc:   PainSc: 0-No pain                 Iran Ouch

## 2022-06-25 NOTE — Op Note (Signed)
OPERATIVE NOTE  Lauren Rosario [id number] 06/25/2022   PREOPERATIVE DIAGNOSIS:  Nuclear sclerotic cataract left eye.  H25.12   POSTOPERATIVE DIAGNOSIS:    Nuclear sclerotic cataract left eye.     PROCEDURE:  Phacoemusification with posterior chamber intraocular lens placement of the left eye   LENS:   Implant Name Type Inv. Item Serial No. Manufacturer Lot No. LRB No. Used Action  LENS IOL TECNIS EYHANCE 22.5 - [id number] Intraocular Lens LENS IOL TECNIS EYHANCE 22.5 [id number] [employer]  Left 1 Implanted      Procedure(s): CATARACT EXTRACTION PHACO AND INTRAOCULAR LENS PLACEMENT (IOC) LEFT  4.40  00:33.2 (Left)  DIB00 ++22.5   ULTRASOUND TIME: 0 minutes 33 seconds.  CDE 4.40   SURGEON:  Benay Pillow, MD, MPH   ANESTHESIA:  Topical with tetracaine drops augmented with 1% preservative-free intracameral lidocaine.  ESTIMATED BLOOD LOSS: <1 mL   COMPLICATIONS:  None.   DESCRIPTION OF PROCEDURE:  The patient was identified in the holding room and transported to the operating room and placed in the supine position under the operating microscope.  The left eye was identified as the operative eye and it was prepped and draped in the usual sterile ophthalmic fashion.   A 1.0 millimeter clear-corneal paracentesis was made at the 5:00 position. 0.5 ml of preservative-free 1% lidocaine with epinephrine was injected into the anterior chamber.  The anterior chamber was filled with viscoelastic.  A 2.4 millimeter keratome was used to make a near-clear corneal incision at the 2:00 position.  A curvilinear capsulorrhexis was made with a cystotome and capsulorrhexis forceps.  Balanced salt solution was used to hydrodissect and hydrodelineate the nucleus.   Phacoemulsification was then used in stop and chop fashion to remove the lens nucleus and epinucleus.  The remaining cortex was then removed using the irrigation and aspiration handpiece. Viscoelastic was then placed into the capsular bag to  distend it for lens placement.  A lens was then injected into the capsular bag.  The remaining viscoelastic was aspirated.   Wounds were hydrated with balanced salt solution.  The anterior chamber was inflated to a physiologic pressure with balanced salt solution.  Intracameral vigamox 0.1 mL undiltued was injected into the eye and a drop placed onto the ocular surface.  No wound leaks were noted.  The patient was taken to the recovery room in stable condition without complications of anesthesia or surgery  Benay Pillow 06/25/2022, 11:45 AM

## 2022-06-25 NOTE — Anesthesia Preprocedure Evaluation (Addendum)
Anesthesia Evaluation  Patient identified by MRN, date of birth, ID band Patient awake    Reviewed: Allergy & Precautions, H&P , NPO status , Patient's Chart, lab work & pertinent test results  Airway Mallampati: III  TM Distance: >3 FB Neck ROM: full    Dental no notable dental hx.    Pulmonary Current Smoker and Patient abstained from smoking.   Pulmonary exam normal        Cardiovascular hypertension, Normal cardiovascular exam     Neuro/Psych  PSYCHIATRIC DISORDERS Anxiety Depression    negative neurological ROS     GI/Hepatic negative GI ROS, Neg liver ROS,,,  Endo/Other  negative endocrine ROS    Renal/GU      Musculoskeletal  (+) Arthritis ,    Abdominal Normal abdominal exam  (+)   Peds  Hematology negative hematology ROS (+)   Anesthesia Other Findings Past Medical History: No date: Allergy     Comment:  seasonal No date: Anxiety No date: Arthritis No date: Depression No date: Hyperlipidemia No date: Hypertension No date: Neuromuscular disorder ([place]) No date: Tobacco use disorder  Past Surgical History: 11/2010: COLON SURGERY     Comment:  Colonoscopy No date: DENTAL SURGERY 09/22/2008: WRIST SURGERY     Comment:  left  BMI    Body Mass Index: 28.72 kg/m      Reproductive/Obstetrics negative OB ROS                              Anesthesia Physical Anesthesia Plan  ASA: 2  Anesthesia Plan: MAC   Post-op Pain Management:    Induction:   PONV Risk Score and Plan:   Airway Management Planned: Natural Airway  Additional Equipment:   Intra-op Plan:   Post-operative Plan:   Informed Consent: I have reviewed the patients History and Physical, chart, labs and discussed the procedure including the risks, benefits and alternatives for the proposed anesthesia with the patient or authorized representative who has indicated his/her understanding and acceptance.        Plan Discussed with: Anesthesiologist, CRNA and Surgeon  Anesthesia Plan Comments:          Anesthesia Quick Evaluation

## 2022-06-25 NOTE — Transfer of Care (Signed)
Immediate Anesthesia Transfer of Care Note  Patient: Lauren Rosario  Procedure(s) Performed: CATARACT EXTRACTION PHACO AND INTRAOCULAR LENS PLACEMENT (IOC) LEFT  4.40  00:33.2 (Left: Eye)  Patient Location: PACU  Anesthesia Type: MAC  Level of Consciousness: awake, alert  and patient cooperative  Airway and Oxygen Therapy: Patient Spontanous Breathing and Patient connected to supplemental oxygen  Post-op Assessment: Post-op Vital signs reviewed, Patient's Cardiovascular Status Stable, Respiratory Function Stable, Patent Airway and No signs of Nausea or vomiting  Post-op Vital Signs: Reviewed and stable  Complications: No notable events documented.

## 2022-06-25 NOTE — H&P (Signed)
[facility]   Primary Care Physician:  Teodora Medici, DO Ophthalmologist: Dr. Benay Pillow  Pre-Procedure History & Physical: HPI:  Lauren Rosario is a 71 y.o. female here for cataract surgery.   Past Medical History:  Diagnosis Date   Allergy    seasonal   Anxiety    Arthritis    Depression    Hyperlipidemia    Hypertension    Neuromuscular disorder ([place])    Tobacco use disorder     Past Surgical History:  Procedure Laterality Date   CATARACT EXTRACTION W/PHACO Right 06/04/2022   Procedure: CATARACT EXTRACTION PHACO AND INTRAOCULAR LENS PLACEMENT (IOC) RIGHT  2.94  00:28.8;  Surgeon: Eulogio Bear, MD;  Location: [facility];  Service: Ophthalmology;  Laterality: Right;   COLON SURGERY  11/2010   Colonoscopy   DENTAL SURGERY     WRIST SURGERY  09/22/2008   left    Prior to Admission medications   Medication Sig Start Date End Date Taking? Authorizing Provider  atorvastatin (LIPITOR) 40 MG tablet Take 1 tablet (40 mg total) by mouth daily. 02/15/22  Yes Teodora Medici, DO  cetirizine (ZYRTEC) 10 MG tablet Take 1 tablet (10 mg total) by mouth daily. 07/23/13  Yes Reed, Tiffany L, DO  hydrochlorothiazide (HYDRODIURIL) 25 MG tablet Take 1 tablet (25 mg total) by mouth daily. 02/15/22  Yes Teodora Medici, DO  clindamycin (CLINDAGEL) 1 % gel Apply topically 2 (two) times daily. 09/29/21   Teodora Medici, DO  ibuprofen (ADVIL) 800 MG tablet Take 1 tablet (800 mg total) by mouth daily as needed for moderate pain. 02/15/22   Teodora Medici, DO    Allergies as of 05/23/2022 - Review Complete 02/15/2022  Allergen Reaction Noted   Latex Other (See Comments) 12/21/2010   Neosporin [neomycin-bacitracin zn-polymyx] Rash 12/21/2010    Family History  Problem Relation Age of Onset   Stroke Mother    Hernia Brother    Heart disease Son        Congenital Heart disease   Breast cancer Maternal Aunt 80    Social History   Socioeconomic History    Marital status: Divorced    Spouse name: Not on file   Number of children: 2   Years of education: Not on file   Highest education level: Not on file  Occupational History   Occupation: Retired  Tobacco Use   Smoking status: Every Day    Packs/day: 0.50    Years: 50.00    Total pack years: 25.00    Types: Cigarettes    Start date: 1973   Smokeless tobacco: Never  Vaping Use   Vaping Use: Never used  Substance and Sexual Activity   Alcohol use: Not Currently   Drug use: No   Sexual activity: Not Currently  Other Topics Concern   Not on file  Social History Narrative   Not on file   Social Determinants of Health   Financial Resource Strain: Low Risk  (02/13/2022)   Overall Financial Resource Strain (CARDIA)    Difficulty of Paying Living Expenses: Not hard at all  Food Insecurity: No Food Insecurity (02/13/2022)   Hunger Vital Sign    Worried About Running Out of Food in the Last Year: Never true    [place] in the Last Year: Never true  Transportation Needs: No Transportation Needs (02/13/2022)   PRAPARE - Hydrologist (Medical): No    Lack of Transportation (Non-Medical): No  Physical  Activity: Not on file  Stress: No Stress Concern Present (02/13/2022)   [place]    Feeling of Stress : Only a little  Social Connections: Moderately Integrated (02/13/2022)   Social Connection and Isolation Panel [NHANES]    Frequency of Communication with Friends and Family: Twice a week    Frequency of Social Gatherings with Friends and Family: More than three times a week    Attends Religious Services: More than 4 times per year    Active Member of [employer] or Organizations: Yes    Attends Music therapist: More than 4 times per year    Marital Status: Divorced  Intimate Partner Violence: Not At Risk (02/13/2022)   Humiliation, Afraid, Rape, and Kick questionnaire     Fear of Current or Ex-Partner: No    Emotionally Abused: No    Physically Abused: No    Sexually Abused: No    Review of Systems: See HPI, otherwise negative ROS  Physical Exam: BP (!) 172/89   Pulse 77   Temp 97.7 F (36.5 C) (Temporal)   Ht '5\' 1"'$  (1.549 m)   Wt 67.1 kg   SpO2 98%   BMI 27.96 kg/m  General:   Alert, cooperative in NAD Head:  Normocephalic and atraumatic. Respiratory:  Normal work of breathing. Cardiovascular:  RRR  Impression/Plan: Lauren Rosario is here for cataract surgery.  Risks, benefits, limitations, and alternatives regarding cataract surgery have been reviewed with the patient.  Questions have been answered.  All parties agreeable.   Benay Pillow, MD  06/25/2022, 11:19 AM

## 2022-06-26 ENCOUNTER — Encounter: Payer: Self-pay | Admitting: Ophthalmology

## 2022-07-03 ENCOUNTER — Ambulatory Visit (LOCAL_COMMUNITY_HEALTH_CENTER): Payer: Self-pay

## 2022-07-03 DIAGNOSIS — Z111 Encounter for screening for respiratory tuberculosis: Secondary | ICD-10-CM

## 2022-07-06 ENCOUNTER — Ambulatory Visit (LOCAL_COMMUNITY_HEALTH_CENTER): Payer: Medicare Other

## 2022-07-06 DIAGNOSIS — Z111 Encounter for screening for respiratory tuberculosis: Secondary | ICD-10-CM

## 2022-07-06 LAB — TB SKIN TEST
Induration: 0 mm
TB Skin Test: NEGATIVE

## 2022-08-14 NOTE — Progress Notes (Unsigned)
Established Patient Office Visit  Subjective    Patient ID: Lauren Rosario, female    DOB: 17-Sep-1950  Age: 72 y.o. MRN: [id number]  CC:  No chief complaint on file.   HPI Lauren Rosario presents to follow up.  Hypertension: -Medications: HCTZ 25 mg -Patient is compliant with above medications and reports no side effects. -Checking BP at home (average): 130/70 -Denies any SOB, CP, vision changes, LE edema or symptoms of hypotension -Exercise: starting to walk a few days a week   HLD: -Medications: Lipitor 40 mg  -Patient is compliant with above medications and reports no side effects.  -Last lipid panel: Lipid Panel     Component Value Date/Time   CHOL 215 (H) 09/29/2021 1612   CHOL 190 07/20/2013 0946   TRIG 118 09/29/2021 1612   HDL 45 (L) 09/29/2021 1612   HDL 49 07/20/2013 0946   CHOLHDL 4.8 09/29/2021 1612   LDLCALC 146 (H) 09/29/2021 1612   LABVLDL 13 07/20/2013 0946    Radicular Lumbar Pain/Neuropathy/Left Hip Pain: -Currently on Gabapentin 100 TID and Mobic 15 mg daily but doesn't like either and wants to be off of both. States the Mobic doesn't do anything for her pain and she wants to be on Ibuprofen 800 mg PRN. Doesn't like the Gabapentin makes her feel "high" and unsteady on her feet.  -Had done PT in past, finished.   Seasonal Allergies: -Currently on Zyrtec 10 mg daily, has chronic sinuitis issues  Pre-Diabetes: -A1c 5.7% 6/23  Acne: -On side of face and back - had been on oral Doxycycline for this in the past   Health Maintenance: -Blood work due -Mammogram: 9/23 - [place] 1 -Colon cancer screening: colonoscopy in the last 10 years, obtaining records -Lung cancer screening due   Outpatient Encounter Medications as of 08/16/2022  Medication Sig   atorvastatin (LIPITOR) 40 MG tablet Take 1 tablet (40 mg total) by mouth daily.   cetirizine (ZYRTEC) 10 MG tablet Take 1 tablet (10 mg total) by mouth daily.   clindamycin (CLINDAGEL) 1 % gel Apply  topically 2 (two) times daily.   hydrochlorothiazide (HYDRODIURIL) 25 MG tablet Take 1 tablet (25 mg total) by mouth daily.   ibuprofen (ADVIL) 800 MG tablet Take 1 tablet (800 mg total) by mouth daily as needed for moderate pain.   No facility-administered encounter medications on file as of 08/16/2022.    Past Medical History:  Diagnosis Date   Allergy    seasonal   Anxiety    Arthritis    Depression    Hyperlipidemia    Hypertension    Neuromuscular disorder ([facility])    Tobacco use disorder     Past Surgical History:  Procedure Laterality Date   CATARACT EXTRACTION W/PHACO Right 06/04/2022   Procedure: CATARACT EXTRACTION PHACO AND INTRAOCULAR LENS PLACEMENT (IOC) RIGHT  2.94  00:28.8;  Surgeon: Nevada Crane, MD;  [facility];  Service: Ophthalmology;  Laterality: Right;   CATARACT EXTRACTION W/PHACO Left 06/25/2022   Procedure: CATARACT EXTRACTION PHACO AND INTRAOCULAR LENS PLACEMENT (IOC) LEFT  4.40  00:33.2;  Surgeon: Nevada Crane, MD;  Location: [facility];  Service: Ophthalmology;  Laterality: Left;   COLON SURGERY  11/2010   Colonoscopy   DENTAL SURGERY     WRIST SURGERY  09/22/2008   left    Family History  Problem Relation Age of Onset   Stroke Mother    Hernia Brother    Heart disease Son  Congenital Heart disease   Breast cancer Maternal Aunt 57    Social History   Socioeconomic History   Marital status: Divorced    Spouse name: Not on file   Number of children: 2   Years of education: Not on file   Highest education level: Not on file  Occupational History   Occupation: Retired  Tobacco Use   Smoking status: Every Day    Packs/day: 0.50    Years: 50.00    Additional pack years: 0.00    Total pack years: 25.00    Types: Cigarettes    Start date: 1973   Smokeless tobacco: Never  Vaping Use   Vaping Use: Never used  Substance and Sexual Activity   Alcohol use: Not Currently   Drug use: No   Sexual  activity: Not Currently  Other Topics Concern   Not on file  Social History Narrative   Not on file   Social Determinants of Health   Financial Resource Strain: Low Risk  (02/13/2022)   Overall Financial Resource Strain (CARDIA)    Difficulty of Paying Living Expenses: Not hard at all  Food Insecurity: No Food Insecurity (02/13/2022)   Hunger Vital Sign    Worried About Running Out of Food in the Last Year: Never true    Ran Out of Food in the Last Year: Never true  Transportation Needs: No Transportation Needs (02/13/2022)   PRAPARE - Administrator, Civil Service (Medical): No    Lack of Transportation (Non-Medical): No  Physical Activity: Not on file  Stress: No Stress Concern Present (02/13/2022)   [employer] of Occupational Health - Occupational Stress Questionnaire    Feeling of Stress : Only a little  Social Connections: Moderately Integrated (02/13/2022)   Social Connection and Isolation Panel [NHANES]    Frequency of Communication with Friends and Family: Twice a week    Frequency of Social Gatherings with Friends and Family: More than three times a week    Attends Religious Services: More than 4 times per year    Active Member of [employer] or Organizations: Yes    Attends Engineer, structural: More than 4 times per year    Marital Status: Divorced  Intimate Partner Violence: Not At Risk (02/13/2022)   Humiliation, Afraid, Rape, and Kick questionnaire    Fear of Current or Ex-Partner: No    Emotionally Abused: No    Physically Abused: No    Sexually Abused: No    Review of Systems  Constitutional:  Negative for chills and fever.  Eyes:  Negative for blurred vision (worsening cataracts).  Respiratory:  Negative for shortness of breath.   Cardiovascular:  Negative for chest pain.  Gastrointestinal:  Negative for abdominal pain, blood in stool, diarrhea, melena, nausea and vomiting.  Musculoskeletal:  Positive for back pain and joint pain.       Objective    There were no vitals taken for this visit.  Physical Exam Constitutional:      Appearance: Normal appearance.  HENT:     Head: Normocephalic and atraumatic.     Nose: Nose normal.     Mouth/Throat:     Mouth: Mucous membranes are moist.     Pharynx: Oropharynx is clear.  Eyes:     Extraocular Movements: Extraocular movements intact.     Conjunctiva/sclera: Conjunctivae normal.     Pupils: Pupils are equal, round, and reactive to light.  Cardiovascular:     Rate and Rhythm: Normal rate  and regular rhythm.  Pulmonary:     Effort: Pulmonary effort is normal.     Breath sounds: Normal breath sounds.  Musculoskeletal:     Right lower leg: No edema.     Left lower leg: No edema.  Lymphadenopathy:     Cervical: No cervical adenopathy.  Skin:    General: Skin is warm and dry.     Comments: Few scattered closed comedones on upper back  Neurological:     General: No focal deficit present.     Mental Status: She is alert. Mental status is at baseline.  Psychiatric:        Mood and Affect: Mood normal.        Behavior: Behavior normal.      Assessment & Plan:   1. Arthritis: Discontinue gabapentin and Mobic because patient states his medications are not improving her symptoms.  Will prescribe ibuprofen 800 mg to take as needed.  Patient was counseled on potential side effects such as chronic kidney disease ulcer disease.  Patient voices understanding and agreement. Follow up in 6 months.   - ibuprofen (ADVIL) 800 MG tablet; Take 1 tablet (800 mg total) by mouth daily as needed for moderate pain.  Dispense: 90 tablet; Refill: 1  2. Essential hypertension, benign: Chronic.  Blood pressure borderline today, however patient states she is in pain due to her arthritis.  Continue HCTZ 25 mg, refilled today.  - hydrochlorothiazide (HYDRODIURIL) 25 MG tablet; Take 1 tablet (25 mg total) by mouth daily.  Dispense: 90 tablet; Refill: 1  3. Hyperlipidemia, unspecified  hyperlipidemia type: Chronic.  Continue Lipitor 40 mg daily, increased since last lipid panel. Plan to recheck at follow up in 6 months.   - atorvastatin (LIPITOR) 40 MG tablet; Take 1 tablet (40 mg total) by mouth daily.  Dispense: 90 tablet; Refill: 3   No follow-ups on file.   Margarita Mail, DO

## 2022-08-16 ENCOUNTER — Ambulatory Visit (INDEPENDENT_AMBULATORY_CARE_PROVIDER_SITE_OTHER): Payer: Medicare Other | Admitting: Internal Medicine

## 2022-08-16 ENCOUNTER — Encounter: Payer: Self-pay | Admitting: Internal Medicine

## 2022-08-16 VITALS — BP 122/78 | HR 91 | Temp 98.5°F | Resp 16 | Ht 61.0 in | Wt 149.4 lb

## 2022-08-16 DIAGNOSIS — J302 Other seasonal allergic rhinitis: Secondary | ICD-10-CM | POA: Diagnosis not present

## 2022-08-16 DIAGNOSIS — E785 Hyperlipidemia, unspecified: Secondary | ICD-10-CM

## 2022-08-16 DIAGNOSIS — I10 Essential (primary) hypertension: Secondary | ICD-10-CM | POA: Diagnosis not present

## 2022-08-16 DIAGNOSIS — J3489 Other specified disorders of nose and nasal sinuses: Secondary | ICD-10-CM | POA: Diagnosis not present

## 2022-08-16 DIAGNOSIS — R7303 Prediabetes: Secondary | ICD-10-CM

## 2022-08-16 DIAGNOSIS — Z716 Tobacco abuse counseling: Secondary | ICD-10-CM

## 2022-08-16 MED ORDER — METHYLPREDNISOLONE 4 MG PO TBPK: ORAL_TABLET | ORAL | 0 refills | Status: DC

## 2022-08-16 MED ORDER — HYDROCHLOROTHIAZIDE 25 MG PO TABS: 25.0000 mg | ORAL_TABLET | Freq: Every day | ORAL | 1 refills | Status: DC

## 2022-08-16 MED ORDER — VARENICLINE TARTRATE (STARTER) 0.5 MG X 11 & 1 MG X 42 PO TBPK: ORAL_TABLET | ORAL | 0 refills | Status: DC

## 2022-08-16 NOTE — Patient Instructions (Addendum)
It was great seeing you today!  Plan discussed at today's visit: -Blood work ordered today, results will be uploaded to MyChart.  -Recommend switching Zyrtec to Xyzal for allergies. Also recommend Flonase (nasal steroid) and Astelin (nasal anti-histamine) sprays. -Steroid prescribed, let me know if symptoms do not resolve -Chantix ordered as well  Follow up in: 6 months  Take care and let us know if you have any questions or concerns prior to your next visit.  Dr. Caralee Ates

## 2022-08-17 LAB — CBC WITH DIFFERENTIAL/PLATELET
Absolute Monocytes: 585 cells/uL (ref 200–950)
Basophils Absolute: 22 cells/uL (ref 0–200)
Basophils Relative: 0.3 %
Eosinophils Absolute: 0 cells/uL — ABNORMAL LOW (ref 15–500)
Eosinophils Relative: 0 %
HCT: 43.8 % (ref 35.0–45.0)
Hemoglobin: 14.5 g/dL (ref 11.7–15.5)
Lymphs Abs: 1761 cells/uL (ref 850–3900)
MCH: 28.7 pg (ref 27.0–33.0)
MCHC: 33.1 g/dL (ref 32.0–36.0)
MCV: 86.7 fL (ref 80.0–100.0)
MPV: 10 fL (ref 7.5–12.5)
Monocytes Relative: 7.9 %
Neutro Abs: 5032 cells/uL (ref 1500–7800)
Neutrophils Relative %: 68 %
Platelets: 293 10*3/uL (ref 140–400)
RBC: 5.05 10*6/uL (ref 3.80–5.10)
RDW: 12.5 % (ref 11.0–15.0)
Total Lymphocyte: 23.8 %
WBC: 7.4 10*3/uL (ref 3.8–10.8)

## 2022-08-17 LAB — HEMOGLOBIN A1C
Hgb A1c MFr Bld: 6 % of total Hgb — ABNORMAL HIGH (ref <5.7)
Mean Plasma Glucose: 126 mg/dL
eAG (mmol/L): 7 mmol/L

## 2022-08-17 LAB — LIPID PANEL
Cholesterol: 164 mg/dL (ref <200)
HDL: 47 mg/dL — ABNORMAL LOW (ref >=50)
LDL Cholesterol (Calc): 101 mg/dL (calc) — ABNORMAL HIGH
Non-HDL Cholesterol (Calc): 117 mg/dL (calc) (ref <130)
Total CHOL/HDL Ratio: 3.5 (calc) (ref <5.0)
Triglycerides: 75 mg/dL (ref <150)

## 2022-08-17 LAB — COMPLETE METABOLIC PANEL WITH GFR
AG Ratio: 1.4 (calc) (ref 1.0–2.5)
ALT: 11 U/L (ref 6–29)
AST: 13 U/L (ref 10–35)
Albumin: 4.5 g/dL (ref 3.6–5.1)
Alkaline phosphatase (APISO): 73 U/L (ref 37–153)
BUN: 16 mg/dL (ref 7–25)
CO2: 23 mmol/L (ref 20–32)
Calcium: 10.6 mg/dL — ABNORMAL HIGH (ref 8.6–10.4)
Chloride: 102 mmol/L (ref 98–110)
Creat: 0.86 mg/dL (ref 0.60–1.00)
Globulin: 3.2 g/dL (calc) (ref 1.9–3.7)
Glucose, Bld: 94 mg/dL (ref 65–99)
Potassium: 4.1 mmol/L (ref 3.5–5.3)
Sodium: 141 mmol/L (ref 135–146)
Total Bilirubin: 0.5 mg/dL (ref 0.2–1.2)
Total Protein: 7.7 g/dL (ref 6.1–8.1)
eGFR: 73 mL/min/{1.73_m2} (ref >=60)

## 2022-11-23 ENCOUNTER — Other Ambulatory Visit: Payer: Self-pay | Admitting: Internal Medicine

## 2022-11-23 DIAGNOSIS — Z1231 Encounter for screening mammogram for malignant neoplasm of breast: Secondary | ICD-10-CM

## 2022-12-21 ENCOUNTER — Ambulatory Visit
Admission: RE | Admit: 2022-12-21 | Discharge: 2022-12-21 | Disposition: A | Discharge status: Home / Self Care | Payer: Medicare Other | Source: Ambulatory Visit | Attending: Internal Medicine | Admitting: Internal Medicine

## 2022-12-21 DIAGNOSIS — Z1231 Encounter for screening mammogram for malignant neoplasm of breast: Secondary | ICD-10-CM | POA: Diagnosis not present

## 2022-12-27 ENCOUNTER — Ambulatory Visit: Payer: Medicare Other | Admitting: Podiatry

## 2023-01-03 ENCOUNTER — Ambulatory Visit: Payer: Medicare Other | Admitting: Podiatry

## 2023-01-03 DIAGNOSIS — B351 Tinea unguium: Secondary | ICD-10-CM | POA: Diagnosis not present

## 2023-01-03 DIAGNOSIS — M79675 Pain in left toe(s): Secondary | ICD-10-CM | POA: Diagnosis not present

## 2023-01-03 DIAGNOSIS — M79674 Pain in right toe(s): Secondary | ICD-10-CM | POA: Diagnosis not present

## 2023-01-03 NOTE — Progress Notes (Signed)
Subjective:  Patient ID: Lauren Rosario, female    DOB: [birth year],  MRN: [id number]  Chief Complaint  Patient presents with   Nail Problem    Nail trim   72 y.o. female returns for the above complaint.  Patient presents with thickened elongated dystrophic mycotic toenails x 10 mild pain on palpation worse with ambulation worse with pressure she would like for me to debride down.  Objective:  There were no vitals filed for this visit. Podiatric Exam: Vascular: dorsalis pedis and posterior tibial pulses are palpable bilateral. Capillary return is immediate. Temperature gradient is WNL. Skin turgor WNL  Sensorium: Normal Semmes Weinstein monofilament test. Normal tactile sensation bilaterally. Nail Exam: Pt has thick disfigured discolored nails with subungual debris noted bilateral entire nail hallux through fifth toenails.  Pain on palpation to the nails. Ulcer Exam: There is no evidence of ulcer or pre-ulcerative changes or infection. Orthopedic Exam: Muscle tone and strength are WNL. No limitations in general ROM. No crepitus or effusions noted.  Skin: No Porokeratosis. No infection or ulcers    Assessment & Plan:  No diagnosis found.  Patient was evaluated and treated and all questions answered.  Onychomycosis with pain  -Nails palliatively debrided as below. -Educated on self-care  Procedure: Nail Debridement Rationale: pain  Type of Debridement: manual, sharp debridement. Instrumentation: Nail nipper, rotary burr. Number of Nails: 10  Procedures and Treatment: Consent by patient was obtained for treatment procedures. The patient understood the discussion of treatment and procedures well. All questions were answered thoroughly reviewed. Debridement of mycotic and hypertrophic toenails, 1 through 5 bilateral and clearing of subungual debris. No ulceration, no infection noted.  Return Visit-Office Procedure: Patient instructed to return to the office for a follow up visit 3  months for continued evaluation and treatment.  Nicholes Rough, DPM    No follow-ups on file. Toenails x10

## 2023-01-31 ENCOUNTER — Other Ambulatory Visit: Payer: Self-pay | Admitting: Internal Medicine

## 2023-01-31 DIAGNOSIS — E785 Hyperlipidemia, unspecified: Secondary | ICD-10-CM

## 2023-01-31 NOTE — Telephone Encounter (Signed)
Requested Prescriptions  Pending Prescriptions Disp Refills   atorvastatin (LIPITOR) 40 MG tablet [Pharmacy Med Name: Atorvastatin Calcium 40 MG Oral Tablet] 90 tablet 0    Sig: Take 1 tablet by mouth once daily     Cardiovascular:  Antilipid - Statins Failed - 01/31/2023  6:43 AM      Failed - Lipid Panel in normal range within the last 12 months    Cholesterol, Total  Date Value Ref Range Status  07/20/2013 190 100 - 199 mg/dL Final   Cholesterol  Date Value Ref Range Status  08/16/2022 164 <200 mg/dL Final   LDL Cholesterol (Calc)  Date Value Ref Range Status  08/16/2022 101 (H) mg/dL (calc) Final    Comment:    Reference range: <100 . Desirable range <100 mg/dL for primary prevention;   <70 mg/dL for patients with CHD or diabetic patients  with > or = 2 CHD risk factors. Marland Kitchen LDL-C is now calculated using the Martin-Hopkins  calculation, which is a validated novel method providing  better accuracy than the Friedewald equation in the  estimation of LDL-C.  Horald Pollen et al. Lenox Ahr. 4259;563(87): 2061-2068  (http://education.[facility].com/faq/FAQ164)    HDL  Date Value Ref Range Status  08/16/2022 47 (L) > OR = 50 mg/dL Final  56/43/3295 49 >18 mg/dL Final    Comment:    According to ATP-III Guidelines, HDL-C >59 mg/dL is considered a negative risk factor for CHD.   Triglycerides  Date Value Ref Range Status  08/16/2022 75 <150 mg/dL Final         Passed - Patient is not pregnant      Passed - Valid encounter within last 12 months    Recent Outpatient Visits           5 months ago Essential hypertension, benign   [facility] Margarita Mail, DO   11 months ago Arthritis   [facility] Margarita Mail, DO   1 year ago Essential hypertension, benign   [facility] Margarita Mail, DO       Future Appointments             In 2 weeks Margarita Mail, DO [facility]  [facility]

## 2023-02-14 ENCOUNTER — Ambulatory Visit: Payer: Medicare Other

## 2023-02-14 DIAGNOSIS — Z0000 Encounter for general adult medical examination without abnormal findings: Secondary | ICD-10-CM

## 2023-02-14 NOTE — Patient Instructions (Addendum)
Lauren Rosario , Thank you for taking time to come for your Medicare Wellness Visit. I appreciate your ongoing commitment to your health goals. Please review the following plan we discussed and let me know if I can assist you in the future.   Referrals/Orders/Follow-Ups/Clinician Recommendations: none  This is a list of the screening recommended for you and due dates:  Health Maintenance  Topic Date Due   Screening for Lung Cancer  Never done   Zoster (Shingles) Vaccine (1 of 2) Never done   COVID-19 Vaccine (5 - 2023-24 season) 12/16/2022   Medicare Annual Wellness Visit  02/14/2024   Mammogram  12/20/2024   Colon Cancer Screening  12/06/2027   Pneumonia Vaccine  Completed   Flu Shot  Completed   DEXA scan (bone density measurement)  Completed   Hepatitis C Screening  Completed   HPV Vaccine  Aged Out   DTaP/Tdap/Td vaccine  Discontinued    Advanced directives: (ACP Link)Information on Advanced Care Planning can be found at [facility] Advance Health Care Directives Advance Health Care Directives ([email or web address]   Next Medicare Annual Wellness Visit scheduled for next year: Yes    02/20/24 @ 8:55 am in person

## 2023-02-14 NOTE — Progress Notes (Signed)
Subjective:   Lauren Rosario is a 72 y.o. female who presents for Medicare Annual (Subsequent) preventive examination.  Visit Complete: Virtual I connected with  Lauren Rosario on 02/14/23 by a audio enabled telemedicine application and verified that I am speaking with the correct person using two identifiers.  Patient Location: Home  Provider Location: Office/Clinic  I discussed the limitations of evaluation and management by telemedicine. The patient expressed understanding and agreed to proceed.  Vital Signs: Because this visit was a virtual/telehealth visit, some criteria may be missing or patient reported. Any vitals not documented were not able to be obtained and vitals that have been documented are patient reported.    Cardiac Risk Factors include: advanced age (>53men, >19 women);hypertension;dyslipidemia SMOKER     Objective:    There were no vitals filed for this visit. There is no height or weight on file to calculate BMI.     02/14/2023    9:24 AM 06/25/2022   10:46 AM 02/13/2022    9:38 AM  Advanced Directives  Does Patient Have a Medical Advance Directive? No Yes No  Type of Special educational needs teacher of [place];Living will   Does patient want to make changes to medical advance directive?  No - Patient declined   Copy of Healthcare Power of Attorney in Chart?  Yes - validated most recent copy scanned in chart (See row information)   Would patient like information on creating a medical advance directive? No - Patient declined  No - Patient declined    Current Medications (verified) Outpatient Encounter Medications as of 02/14/2023  Medication Sig   atorvastatin (LIPITOR) 40 MG tablet Take 1 tablet by mouth once daily   cetirizine (ZYRTEC) 10 MG tablet Take 1 tablet (10 mg total) by mouth daily.   clindamycin (CLINDAGEL) 1 % gel Apply topically 2 (two) times daily.   hydrochlorothiazide (HYDRODIURIL) 25 MG tablet Take 1 tablet (25 mg total) by mouth  daily.   ibuprofen (ADVIL) 800 MG tablet Take 1 tablet (800 mg total) by mouth daily as needed for moderate pain.   methylPREDNISolone (MEDROL DOSEPAK) 4 MG TBPK tablet Day 1: Take 8 mg (2 tablets) before breakfast, 4 mg (1 tablet) after lunch, 4 mg (1 tablet) after supper, and 8 mg (2 tablets) at bedtime. Day 2:Take 4 mg (1 tablet) before breakfast, 4 mg (1 tablet) after lunch, 4 mg (1 tablet) after supper, and 8 mg (2 tablets) at bedtime. Day 3: Take 4 mg (1 tablet) before breakfast, 4 mg (1 tablet) after lunch, 4 mg (1 tablet) after supper, and 4 mg (1 tablet) at bedtime. Day 4: Take 4 mg (1 tablet) before breakfast, 4 mg (1 tablet) after lunch, and 4 mg (1 tablet) at bedtime. Day 5: Take 4 mg (1 tablet) before breakfast and 4 mg (1 tablet) at bedtime. Day 6: Take 4 mg (1 tablet) before breakfast. (Patient not taking: Reported on 02/14/2023)   Varenicline Tartrate, Starter, (CHANTIX STARTING MONTH PAK) 0.5 MG X 11 & 1 MG X 42 TBPK Days 1 to 3: 0.5 mg once daily. Days 4 to 7: 0.5 mg twice daily. Maintenance (day 8 and later): 1 mg twice daily; may consider a temporary or permanent dose reduction if usual dose is not tolerated. (Patient not taking: Reported on 02/14/2023)   No facility-administered encounter medications on file as of 02/14/2023.    Allergies (verified) Latex and Neosporin [neomycin-bacitracin zn-polymyx]   History: Past Medical History:  Diagnosis Date   Allergy  seasonal   Anxiety    Arthritis    Depression    Hyperlipidemia    Hypertension    Neuromuscular disorder (HCC)    Tobacco use disorder    Past Surgical History:  Procedure Laterality Date   CATARACT EXTRACTION W/PHACO Right 06/04/2022   Procedure: CATARACT EXTRACTION PHACO AND INTRAOCULAR LENS PLACEMENT (IOC) RIGHT  2.94  00:28.8;  Surgeon: Nevada Crane, MD;  Location: [facility];  Service: Ophthalmology;  Laterality: Right;   CATARACT EXTRACTION W/PHACO Left 06/25/2022   Procedure: CATARACT  EXTRACTION PHACO AND INTRAOCULAR LENS PLACEMENT (IOC) LEFT  4.40  00:33.2;  Surgeon: Nevada Crane, MD;  Location: [facility];  Service: Ophthalmology;  Laterality: Left;   COLON SURGERY  11/2010   Colonoscopy   DENTAL SURGERY     WRIST SURGERY  09/22/2008   left   Family History  Problem Relation Age of Onset   Stroke Mother    Hernia Brother    Heart disease Son        Congenital Heart disease   Breast cancer Maternal Aunt 83   Social History   Socioeconomic History   Marital status: Divorced    Spouse name: Not on file   Number of children: 2   Years of education: Not on file   Highest education level: Not on file  Occupational History   Occupation: Retired  Tobacco Use   Smoking status: Every Day    Current packs/day: 0.50    Average packs/day: 0.5 packs/day for 51.8 years (25.9 ttl pk-yrs)    Types: Cigarettes    Start date: 1973   Smokeless tobacco: Never  Vaping Use   Vaping status: Never Used  Substance and Sexual Activity   Alcohol use: Not Currently   Drug use: No   Sexual activity: Not Currently  Other Topics Concern   Not on file  Social History Narrative   Not on file   Social Determinants of Health   Financial Resource Strain: Low Risk  (02/14/2023)   Overall Financial Resource Strain (CARDIA)    Difficulty of Paying Living Expenses: Not hard at all  Food Insecurity: No Food Insecurity (02/14/2023)   Hunger Vital Sign    Worried About Running Out of Food in the Last Year: Never true    Ran Out of Food in the Last Year: Never true  Transportation Needs: No Transportation Needs (02/14/2023)   PRAPARE - Administrator, Civil Service (Medical): No    Lack of Transportation (Non-Medical): No  Physical Activity: Sufficiently Active (02/14/2023)   Exercise Vital Sign    Days of Exercise per Week: 3 days    Minutes of Exercise per Session: 50 min  Stress: No Stress Concern Present (02/14/2023)   [employer] of  Occupational Health - Occupational Stress Questionnaire    Feeling of Stress : Not at all  Social Connections: Moderately Integrated (02/14/2023)   Social Connection and Isolation Panel [NHANES]    Frequency of Communication with Friends and Family: Three times a week    Frequency of Social Gatherings with Friends and Family: Once a week    Attends Religious Services: More than 4 times per year    Active Member of [employer] or Organizations: Yes    Attends Engineer, structural: More than 4 times per year    Marital Status: Divorced    Tobacco Counseling Ready to quit: Not Answered Counseling given: Not Answered   Clinical Intake:  Pre-visit preparation completed:  Yes  Pain : No/denies pain     Nutritional Status: BMI of 19-24  Normal Nutritional Risks: None Diabetes: No  How often do you need to have someone help you when you read instructions, pamphlets, or other written materials from your doctor or pharmacy?: 1 - Never  Interpreter Needed?: No  Information entered by :: Kennedy Bucker, LPN   Activities of Daily Living    02/14/2023    9:25 AM 08/16/2022    8:18 AM  In your present state of health, do you have any difficulty performing the following activities:  Hearing? 0 0  Vision? 0 0  Difficulty concentrating or making decisions? 0 0  Walking or climbing stairs? 0 0  Dressing or bathing? 0 0  Doing errands, shopping? 0 0  Preparing Food and eating ? N   Using the Toilet? N   In the past six months, have you accidently leaked urine? N   Do you have problems with loss of bowel control? N   Managing your Medications? N   Managing your Finances? N   Housekeeping or managing your Housekeeping? N     Patient Care Team: Margarita Mail, DO as PCP - General (Internal Medicine) Nevada Crane, MD as Consulting Physician (Ophthalmology)  Indicate any recent Medical Services you may have received from other than [facility] providers in the past year (date  may be approximate).     Assessment:   This is a routine wellness examination for Karmina.  Hearing/Vision screen Hearing Screening - Comments:: No aids Vision Screening - Comments:: No glasses- Dr.King    Goals Addressed             This Visit's Progress    DIET - EAT MORE FRUITS AND VEGETABLES         Depression Screen    02/14/2023    9:22 AM 08/16/2022    8:18 AM 02/15/2022    2:39 PM 02/13/2022    9:04 AM 09/29/2021    3:19 PM  PHQ 2/9 Scores  PHQ - 2 Score 0 0 0 2 0  PHQ- 9 Score 0 0 0 5 0    Fall Risk    02/14/2023    9:25 AM 08/16/2022    8:18 AM 02/15/2022    2:36 PM 02/13/2022    9:05 AM 09/29/2021    3:18 PM  Fall Risk   Falls in the past year? 0 0 0 0 0  Number falls in past yr: 0 0 0 0 0  Injury with Fall? 0 0 0 0 0  Risk for fall due to : No Fall Risks   No Fall Risks   Follow up Falls prevention discussed;Falls evaluation completed   Falls evaluation completed     MEDICARE RISK AT HOME: Medicare Risk at Home Any stairs in or around the home?: No If so, are there any without handrails?: No Home free of loose throw rugs in walkways, pet beds, electrical cords, etc?: Yes Adequate lighting in your home to reduce risk of falls?: Yes Life alert?: No Use of a Rosario, walker or w/c?: No Grab bars in the bathroom?: No Shower chair or bench in shower?: No Elevated toilet seat or a handicapped toilet?: Yes  TIMED UP AND GO:  Was the test performed?  No    Cognitive Function:        02/14/2023    9:26 AM 02/13/2022    9:06 AM  6CIT Screen  What Year? 0 points 0 points  What month? 0 points 0 points  What time? 0 points 0 points  Count back from 20 0 points 0 points  Months in reverse 0 points 0 points  Repeat phrase 0 points 0 points  Total Score 0 points 0 points    Immunizations Immunization History  Administered Date(s) Administered   Influenza-Unspecified 01/15/2022, 02/02/2023   Moderna Sars-Covid-2 Vaccination 04/22/2019, 05/20/2019,  04/13/2020   PNEUMOCOCCAL CONJUGATE-20 09/29/2021   PPD Test 03/20/2021, 07/03/2022   Pfizer(Comirnaty)Fall Seasonal Vaccine 12 years and older 02/09/2022   Pneumococcal Conjugate-13 02/20/2017    TDAP status: Due, Education has been provided regarding the importance of this vaccine. Advised may receive this vaccine at local pharmacy or Health Dept. Aware to provide a copy of the vaccination record if obtained from local pharmacy or Health Dept. Verbalized acceptance and understanding.  Flu Vaccine status: Up to date  Pneumococcal vaccine status: Up to date  Covid-19 vaccine status: Completed vaccines  Qualifies for Shingles Vaccine? Yes   Zostavax completed No   Shingrix Completed?: Yes  Screening Tests Health Maintenance  Topic Date Due   Lung Cancer Screening  Never done   Zoster Vaccines- Shingrix (1 of 2) Never done   COVID-19 Vaccine (5 - 2023-24 season) 12/16/2022   Medicare Annual Wellness (AWV)  02/14/2024   MAMMOGRAM  12/20/2024   Colonoscopy  12/06/2027   Pneumonia Vaccine 61+ Years old  Completed   INFLUENZA VACCINE  Completed   DEXA SCAN  Completed   Hepatitis C Screening  Completed   HPV VACCINES  Aged Out   DTaP/Tdap/Td  Discontinued    Health Maintenance  Health Maintenance Due  Topic Date Due   Lung Cancer Screening  Never done   Zoster Vaccines- Shingrix (1 of 2) Never done   COVID-19 Vaccine (5 - 2023-24 season) 12/16/2022    Colorectal cancer screening: Type of screening: Colonoscopy. Completed 12/05/17. Repeat every 10 years  Mammogram status: Completed 12/21/22. Repeat every year  Bone Density status: Completed 12/19/21. Results reflect: Bone density results: NORMAL. Repeat every 5 years.  Lung Cancer Screening: (Low Dose CT Chest recommended if Age 81-80 years, 20 pack-year currently smoking OR have quit w/in 15years.) does qualify.   Lung Cancer Screening Referral:  declined/deferred referral  Additional Screening:  Hepatitis C Screening:  does qualify; Completed 09/29/21  Vision Screening: Recommended annual ophthalmology exams for early detection of glaucoma and other disorders of the eye. Is the patient up to date with their annual eye exam?  Yes  Who is the provider or what is the name of the office in which the patient attends annual eye exams? Dr.King If pt is not established with a provider, would they like to be referred to a provider to establish care? No .   Dental Screening: Recommended annual dental exams for proper oral hygiene   Community Resource Referral / Chronic Care Management: CRR required this visit?  No   CCM required this visit?  No     Plan:     I have personally reviewed and noted the following in the patient's chart:   Medical and social history Use of alcohol, tobacco or illicit drugs  Current medications and supplements including opioid prescriptions. Patient is not currently taking opioid prescriptions. Functional ability and status Nutritional status Physical activity Advanced directives List of other physicians Hospitalizations, surgeries, and ER visits in previous 12 months Vitals Screenings to include cognitive, depression, and falls Referrals and appointments  In addition, I have reviewed and discussed with patient  certain preventive protocols, quality metrics, and best practice recommendations. A written personalized care plan for preventive services as well as general preventive health recommendations were provided to patient.     Hal Hope, LPN   13/11/6576   After Visit Summary: (MyChart) Due to this being a telephonic visit, the after visit summary with patients personalized plan was offered to patient via MyChart   Nurse Notes: none

## 2023-02-18 ENCOUNTER — Encounter: Payer: Self-pay | Admitting: Internal Medicine

## 2023-02-18 ENCOUNTER — Ambulatory Visit (INDEPENDENT_AMBULATORY_CARE_PROVIDER_SITE_OTHER): Payer: Medicare Other | Admitting: Internal Medicine

## 2023-02-18 VITALS — BP 120/74 | HR 74 | Temp 98.3°F | Resp 18 | Ht 61.0 in | Wt 141.3 lb

## 2023-02-18 DIAGNOSIS — E785 Hyperlipidemia, unspecified: Secondary | ICD-10-CM

## 2023-02-18 DIAGNOSIS — I10 Essential (primary) hypertension: Secondary | ICD-10-CM | POA: Diagnosis not present

## 2023-02-18 DIAGNOSIS — R7303 Prediabetes: Secondary | ICD-10-CM | POA: Diagnosis not present

## 2023-02-18 LAB — POCT GLYCOSYLATED HEMOGLOBIN (HGB A1C): Hemoglobin A1C: 5.7 % — AB (ref 4.0–5.6)

## 2023-02-18 MED ORDER — ATORVASTATIN CALCIUM 40 MG PO TABS: 40.0000 mg | ORAL_TABLET | Freq: Every day | ORAL | 1 refills | Status: DC

## 2023-02-18 MED ORDER — HYDROCHLOROTHIAZIDE 12.5 MG PO CAPS: 12.5000 mg | ORAL_CAPSULE | Freq: Every day | ORAL | 0 refills | Status: DC

## 2023-03-11 ENCOUNTER — Other Ambulatory Visit: Payer: Self-pay | Admitting: Internal Medicine

## 2023-03-11 DIAGNOSIS — I10 Essential (primary) hypertension: Secondary | ICD-10-CM

## 2023-03-12 NOTE — Telephone Encounter (Signed)
Requested by interface surescripts. Medication discontinued 02/18/23. Dose changed.  Requested Prescriptions  Refused Prescriptions Disp Refills   hydrochlorothiazide (HYDRODIURIL) 25 MG tablet [Pharmacy Med Name: hydroCHLOROthiazide 25 MG Oral Tablet] 90 tablet 0    Sig: Take 1 tablet by mouth once daily     Cardiovascular: Diuretics - Thiazide Failed - 03/11/2023  6:47 AM      Failed - Cr in normal range and within 180 days    Creat  Date Value Ref Range Status  08/16/2022 0.86 0.60 - 1.00 mg/dL Final         Failed - K in normal range and within 180 days    Potassium  Date Value Ref Range Status  08/16/2022 4.1 3.5 - 5.3 mmol/L Final         Failed - Na in normal range and within 180 days    Sodium  Date Value Ref Range Status  08/16/2022 141 135 - 146 mmol/L Final  07/20/2013 141 134 - 144 mmol/L Final         Passed - Last BP in normal range    BP Readings from Last 1 Encounters:  02/18/23 120/74         Passed - Valid encounter within last 6 months    Recent Outpatient Visits           3 weeks ago Essential hypertension, benign   [facility] Margarita Mail, DO   6 months ago Essential hypertension, benign   [facility] Margarita Mail, DO   1 year ago Arthritis   [facility] Margarita Mail, DO   1 year ago Essential hypertension, benign   [facility] Margarita Mail, DO       Future Appointments             In 1 week Margarita Mail, DO [facility], [facility]

## 2023-03-18 NOTE — Progress Notes (Unsigned)
Established Patient Office Visit  Subjective    Patient ID: Lauren Rosario, female    DOB: 07-09-1950  Age: 72 y.o. MRN: [id number]  CC:  No chief complaint on file.   HPI [name] presents to follow up. Hypertension: -Medications: HCTZ decreased to 12.5 mg at LOV -Patient is compliant with above medications and reports no side effects. -Checking BP at home (average): 130/70 -Denies any SOB, CP, vision changes, LE edema. Will feel lightheaded and dizzy in the mornings after taking her BP medication but after she eats this will resolve  -Exercise: walking a few days a week   HLD: -Medications: Lipitor 40 mg  -Patient is compliant with above medications and reports no side effects.  -Last lipid panel: Lipid Panel     Component Value Date/Time   CHOL 164 08/16/2022 0845   CHOL 190 07/20/2013 0946   TRIG 75 08/16/2022 0845   HDL 47 (L) 08/16/2022 0845   HDL 49 07/20/2013 0946   CHOLHDL 3.5 08/16/2022 0845   LDLCALC 101 (H) 08/16/2022 0845   LABVLDL 13 07/20/2013 0946    Radicular Lumbar Pain/Neuropathy/Left Hip Pain: -Had been on Gabapentin 100 TID and Mobic 15 mg daily for pain but now not on anything - occasionally takes Ibuprofen 800 mg PRN. Doesn't like the Gabapentin makes her feel "high" and unsteady on her feet.  -Had done PT in past, finished.  Seasonal Allergies: -Currently on Zyrtec 10 mg OTC daily - sinus issues improved now that her teeth have been pulled  Pre-Diabetes: -A1c 6.0% 5/24 -Not currently on medication  Health Maintenance: -Blood work UTD -Mammogram: 9/24 - Birads 1 -Colon cancer screening: colonoscopy in the last 10 years, obtaining records -Lung cancer screening due - ordered at LOV, patient received a letter but waiting to schedule   Outpatient Encounter Medications as of 03/19/2023  Medication Sig   atorvastatin (LIPITOR) 40 MG tablet Take 1 tablet (40 mg total) by mouth daily.   cetirizine (ZYRTEC) 10 MG tablet Take 1 tablet (10 mg  total) by mouth daily.   clindamycin (CLINDAGEL) 1 % gel Apply topically 2 (two) times daily.   hydrochlorothiazide (MICROZIDE) 12.5 MG capsule Take 1 capsule (12.5 mg total) by mouth daily.   ibuprofen (ADVIL) 800 MG tablet Take 1 tablet (800 mg total) by mouth daily as needed for moderate pain.   No facility-administered encounter medications on file as of 03/19/2023.    Past Medical History:  Diagnosis Date   Allergy    seasonal   Anxiety    Arthritis    Depression    Hyperlipidemia    Hypertension    Neuromuscular disorder (HCC)    Tobacco use disorder     Past Surgical History:  Procedure Laterality Date   CATARACT EXTRACTION W/PHACO Right 06/04/2022   Procedure: CATARACT EXTRACTION PHACO AND INTRAOCULAR LENS PLACEMENT (IOC) RIGHT  2.94  00:28.8;  Surgeon: Nevada Crane, MD;  Location: [facility];  Service: Ophthalmology;  Laterality: Right;   CATARACT EXTRACTION W/PHACO Left 06/25/2022   Procedure: CATARACT EXTRACTION PHACO AND INTRAOCULAR LENS PLACEMENT (IOC) LEFT  4.40  00:33.2;  Surgeon: Nevada Crane, MD;  Location: [facility];  Service: Ophthalmology;  Laterality: Left;   COLON SURGERY  11/2010   Colonoscopy   DENTAL SURGERY     WRIST SURGERY  09/22/2008   left    Family History  Problem Relation Age of Onset   Stroke Mother    Hernia Brother    Heart disease  Son        Congenital Heart disease   Breast cancer Maternal Aunt 29    Social History   Socioeconomic History   Marital status: Divorced    Spouse name: Not on file   Number of children: 2   Years of education: Not on file   Highest education level: Not on file  Occupational History   Occupation: Retired  Tobacco Use   Smoking status: Every Day    Current packs/day: 0.50    Average packs/day: 0.5 packs/day for 51.9 years (26.0 ttl pk-yrs)    Types: Cigarettes    Start date: 1973   Smokeless tobacco: Never  Vaping Use   Vaping status: Never Used  Substance and Sexual  Activity   Alcohol use: Not Currently   Drug use: No   Sexual activity: Not Currently  Other Topics Concern   Not on file  Social History Narrative   Not on file   Social Determinants of Health   Financial Resource Strain: Low Risk  (02/14/2023)   Overall Financial Resource Strain (CARDIA)    Difficulty of Paying Living Expenses: Not hard at all  Food Insecurity: No Food Insecurity (02/14/2023)   Hunger Vital Sign    Worried About Running Out of Food in the Last Year: Never true    Ran Out of Food in the Last Year: Never true  Transportation Needs: No Transportation Needs (02/14/2023)   PRAPARE - Administrator, Civil Service (Medical): No    Lack of Transportation (Non-Medical): No  Physical Activity: Sufficiently Active (02/14/2023)   Exercise Vital Sign    Days of Exercise per Week: 3 days    Minutes of Exercise per Session: 50 min  Stress: No Stress Concern Present (02/14/2023)   [employer] of Occupational Health - Occupational Stress Questionnaire    Feeling of Stress : Not at all  Social Connections: Moderately Integrated (02/14/2023)   Social Connection and Isolation Panel [NHANES]    Frequency of Communication with Friends and Family: Three times a week    Frequency of Social Gatherings with Friends and Family: Once a week    Attends Religious Services: More than 4 times per year    Active Member of [employer] or Organizations: Yes    Attends Engineer, structural: More than 4 times per year    Marital Status: Divorced  Intimate Partner Violence: Not At Risk (02/14/2023)   Humiliation, Afraid, Rape, and Kick questionnaire    Fear of Current or Ex-Partner: No    Emotionally Abused: No    Physically Abused: No    Sexually Abused: No    Review of Systems  Constitutional:  Negative for chills and fever.  Respiratory:  Negative for shortness of breath.   Cardiovascular:  Negative for chest pain.  Gastrointestinal:  Negative for abdominal pain.   Neurological:  Positive for dizziness.      Objective    There were no vitals taken for this visit.  Physical Exam Constitutional:      Appearance: Normal appearance.  HENT:     Head: Normocephalic and atraumatic.  Eyes:     Conjunctiva/sclera: Conjunctivae normal.  Cardiovascular:     Rate and Rhythm: Normal rate and regular rhythm.  Pulmonary:     Effort: Pulmonary effort is normal.     Breath sounds: Normal breath sounds.  Musculoskeletal:     Right lower leg: No edema.     Left lower leg: No edema.  Skin:  General: Skin is warm and dry.  Neurological:     General: No focal deficit present.     Mental Status: She is alert. Mental status is at baseline.  Psychiatric:        Mood and Affect: Mood normal.        Behavior: Behavior normal.      Assessment & Plan:   1. Essential hypertension, benign: BP good here today, patient having some light-headedness at home, will decrease hydrochlorothiazide to 12.5 mg and have her follow up here in 1 month for recheck.   - hydrochlorothiazide (MICROZIDE) 12.5 MG capsule; Take 1 capsule (12.5 mg total) by mouth daily.  Dispense: 90 capsule; Refill: 0  2. Hyperlipidemia, unspecified hyperlipidemia type: Stable, refill statin.  - atorvastatin (LIPITOR) 40 MG tablet; Take 1 tablet (40 mg total) by mouth daily.  Dispense: 90 tablet; Refill: 1  3. Prediabetes: A1c improved to 5.7% today.   - POCT HgB A1C   No follow-ups on file.   Margarita Mail, DO

## 2023-03-19 ENCOUNTER — Other Ambulatory Visit: Payer: Self-pay

## 2023-03-19 ENCOUNTER — Encounter: Payer: Self-pay | Admitting: Internal Medicine

## 2023-03-19 ENCOUNTER — Ambulatory Visit (INDEPENDENT_AMBULATORY_CARE_PROVIDER_SITE_OTHER): Payer: Medicare Other | Admitting: Internal Medicine

## 2023-03-19 VITALS — BP 136/82 | HR 73 | Temp 97.9°F | Resp 16 | Ht 61.0 in | Wt 141.0 lb

## 2023-03-19 DIAGNOSIS — E785 Hyperlipidemia, unspecified: Secondary | ICD-10-CM | POA: Diagnosis not present

## 2023-03-19 DIAGNOSIS — I10 Essential (primary) hypertension: Secondary | ICD-10-CM | POA: Diagnosis not present

## 2023-03-19 MED ORDER — ATORVASTATIN CALCIUM 40 MG PO TABS: 40.0000 mg | ORAL_TABLET | Freq: Every day | ORAL | 1 refills | Status: DC

## 2023-03-19 MED ORDER — HYDROCHLOROTHIAZIDE 12.5 MG PO CAPS: 12.5000 mg | ORAL_CAPSULE | Freq: Every day | ORAL | 1 refills | Status: DC

## 2023-03-21 ENCOUNTER — Other Ambulatory Visit: Payer: Self-pay | Admitting: Internal Medicine

## 2023-03-21 DIAGNOSIS — E785 Hyperlipidemia, unspecified: Secondary | ICD-10-CM

## 2023-03-21 DIAGNOSIS — I10 Essential (primary) hypertension: Secondary | ICD-10-CM

## 2023-03-21 NOTE — Telephone Encounter (Addendum)
Medication Refill -  Most Recent Primary Care Visit:  Provider: Margarita Mail  Department: [facility]  Visit Type: OFFICE VISIT  Date: 02/18/2023  Medication:  atorvastatin (LIPITOR) 40 MG tablet  hydrochlorothiazide (MICROZIDE) 12.5 MG capsule  Has the patient contacted their pharmacy? Yes The pharmacy states they never received the previous scripts that were sent in. Is this the correct pharmacy for this prescription? no If no, delete pharmacy and type the correct one.  This is the patient's preferred pharmacy: [facility] [address] [place] - [address]    Has the prescription been filled recently? No  Is the patient out of the medication? Yes  Has the patient been seen for an appointment in the last year OR does the patient have an upcoming appointment? Yes  Can we respond through MyChart? No  Agent: Please be advised that Rx refills may take up to 3 business days. We ask that you follow-up with your pharmacy.

## 2023-03-22 MED ORDER — HYDROCHLOROTHIAZIDE 12.5 MG PO CAPS: 12.5000 mg | ORAL_CAPSULE | Freq: Every day | ORAL | 1 refills | Status: DC

## 2023-03-22 MED ORDER — ATORVASTATIN CALCIUM 40 MG PO TABS: 40.0000 mg | ORAL_TABLET | Freq: Every day | ORAL | 1 refills | Status: DC

## 2023-03-22 NOTE — Telephone Encounter (Signed)
Change of pharmacy Requested Prescriptions  Pending Prescriptions Disp Refills   atorvastatin (LIPITOR) 40 MG tablet 90 tablet 1    Sig: Take 1 tablet (40 mg total) by mouth daily.     Cardiovascular:  Antilipid - Statins Failed - 03/21/2023 12:06 PM      Failed - Lipid Panel in normal range within the last 12 months    Cholesterol, Total  Date Value Ref Range Status  07/20/2013 190 100 - 199 mg/dL Final   Cholesterol  Date Value Ref Range Status  08/16/2022 164 <200 mg/dL Final   LDL Cholesterol (Calc)  Date Value Ref Range Status  08/16/2022 101 (H) mg/dL (calc) Final    Comment:    Reference range: <100 . Desirable range <100 mg/dL for primary prevention;   <70 mg/dL for patients with CHD or diabetic patients  with > or = 2 CHD risk factors. Marland Kitchen LDL-C is now calculated using the Martin-Hopkins  calculation, which is a validated novel method providing  better accuracy than the Friedewald equation in the  estimation of LDL-C.  Horald Pollen et al. Lenox Ahr. 3329;518(84): 2061-2068  (http://education.[facility].com/faq/FAQ164)    HDL  Date Value Ref Range Status  08/16/2022 47 (L) > OR = 50 mg/dL Final  16/60/6301 49 >60 mg/dL Final    Comment:    According to ATP-III Guidelines, HDL-C >59 mg/dL is considered a negative risk factor for CHD.   Triglycerides  Date Value Ref Range Status  08/16/2022 75 <150 mg/dL Final         Passed - Patient is not pregnant      Passed - Valid encounter within last 12 months    Recent Outpatient Visits           3 days ago Essential hypertension, benign   [facility] Margarita Mail, DO   1 month ago Essential hypertension, benign   [facility] Margarita Mail, DO   7 months ago Essential hypertension, benign   [facility] Margarita Mail, DO   1 year ago Arthritis   [facility] Margarita Mail, DO   1 year  ago Essential hypertension, benign   [facility] Margarita Mail, DO               hydrochlorothiazide (MICROZIDE) 12.5 MG capsule 90 capsule 1    Sig: Take 1 capsule (12.5 mg total) by mouth daily.     Cardiovascular: Diuretics - Thiazide Failed - 03/21/2023 12:06 PM      Failed - Cr in normal range and within 180 days    Creat  Date Value Ref Range Status  08/16/2022 0.86 0.60 - 1.00 mg/dL Final         Failed - K in normal range and within 180 days    Potassium  Date Value Ref Range Status  08/16/2022 4.1 3.5 - 5.3 mmol/L Final         Failed - Na in normal range and within 180 days    Sodium  Date Value Ref Range Status  08/16/2022 141 135 - 146 mmol/L Final  07/20/2013 141 134 - 144 mmol/L Final         Passed - Last BP in normal range    BP Readings from Last 1 Encounters:  03/19/23 136/82         Passed - Valid encounter within last 6 months    Recent Outpatient Visits  3 days ago Essential hypertension, benign   [facility] Margarita Mail, DO   1 month ago Essential hypertension, benign   [facility] Margarita Mail, DO   7 months ago Essential hypertension, benign   [facility] Margarita Mail, DO   1 year ago Arthritis   [facility] Margarita Mail, DO   1 year ago Essential hypertension, benign   [facility] Margarita Mail, [place]

## 2023-04-04 ENCOUNTER — Ambulatory Visit: Payer: Medicare Other | Admitting: Podiatry

## 2023-04-04 ENCOUNTER — Encounter: Payer: Self-pay | Admitting: Podiatry

## 2023-04-04 DIAGNOSIS — M79674 Pain in right toe(s): Secondary | ICD-10-CM | POA: Diagnosis not present

## 2023-04-04 DIAGNOSIS — B351 Tinea unguium: Secondary | ICD-10-CM

## 2023-04-04 DIAGNOSIS — M79675 Pain in left toe(s): Secondary | ICD-10-CM | POA: Diagnosis not present

## 2023-04-04 DIAGNOSIS — B353 Tinea pedis: Secondary | ICD-10-CM | POA: Diagnosis not present

## 2023-04-04 MED ORDER — CLOTRIMAZOLE-BETAMETHASONE 1-0.05 % EX CREA: TOPICAL_CREAM | CUTANEOUS | 1 refills | Status: DC

## 2023-04-04 NOTE — Patient Instructions (Signed)
 To prevent reinfection, spray shoes with lysol every evening.  Clean tub or shower with bleach based cleanser.  Athlete's Foot Athlete's foot (tinea pedis) is a fungal infection of the skin on your feet. It often occurs on the skin that is between or underneath the toes. It can also occur on the soles of your feet. The infection can spread from person to person (is contagious). It can also spread when a person's bare feet come in contact with the fungus on shower floors or on items such as shoes. What are the causes? This condition is caused by a fungus that grows in warm, moist places. You can get athlete's foot by sharing shoes, shower stalls, towels, and wet floors with someone who is infected. Not washing your feet or changing your socks often enough can also lead to athlete's foot. What increases the risk? This condition is more likely to develop in: Men. People who have a weak body defense system (immune system). People who have diabetes. People who use public showers, such as at a gym. People who wear heavy-duty shoes, such as industrial or military shoes. Seasons with warm, humid weather. What are the signs or symptoms? Symptoms of this condition include: Itchy areas between your toes or on the soles of your feet. White, flaky, or scaly areas between your toes or on the soles of your feet. Very itchy small blisters between your toes or on the soles of your feet. Small cuts in your skin. These cuts can become infected. Thick or discolored toenails. How is this diagnosed? This condition may be diagnosed with a physical exam and a review of your medical history. Your health care provider may also take a skin or toenail sample to examine under a microscope. How is this treated? This condition is treated with antifungal medicines. These may be applied as powders, ointments, or creams. In severe cases, an oral antifungal medicine may be given. Follow these instructions at  home: Medicines Apply or take over-the-counter and prescription medicines only as told by your health care provider. Apply your antifungal medicine as told by your health care provider. Do not stop using the antifungal even if your condition improves. Foot care Do not scratch your feet. Keep your feet dry: Wear cotton or wool socks. Change your socks every day or if they become wet. Wear shoes that allow air to flow, such as sandals or canvas tennis shoes. Wash and dry your feet, including the area between your toes. Also, wash and dry your feet: Every day or as told by your health care provider. After exercising. General instructions Do not let others use towels, shoes, nail clippers, or other personal items that touch your feet. Protect your feet by wearing sandals in wet areas, such as locker rooms and shared showers. Keep all follow-up visits. This is important. If you have diabetes, keep your blood sugar under control. Contact a health care provider if: You have a fever. You have swelling, soreness, warmth, or redness in your foot. Your feet are not getting better with treatment. Your symptoms get worse. You have new symptoms. You have severe pain. Summary Athlete's foot (tinea pedis) is a fungal infection of the skin on your feet. It often occurs on skin that is between or underneath the toes. This condition is caused by a fungus that grows in warm, moist places. Symptoms include white, flaky, or scaly areas between your toes or on the soles of your feet. This condition is treated with antifungal medicines.   Keep your feet clean. Always dry them thoroughly. This information is not intended to replace advice given to you by your health care provider. Make sure you discuss any questions you have with your health care provider. Document Revised: 07/24/2020 Document Reviewed: 07/24/2020 Elsevier Patient Education  2024 Elsevier Inc.  

## 2023-04-04 NOTE — Progress Notes (Signed)
  Subjective:  Patient ID: Lauren Rosario, female    DOB: [birth year],  MRN: [id number]  72 y.o. female presents painful elongated mycotic toenails 1-5 bilaterally which are tender when wearing enclosed shoe gear. Pain is relieved with periodic professional debridement.  Today, patient states her feet are itching and she would like this addressed as well.  PCP is Margarita Mail, DO , and last visit was March 19, 2023.  Allergies  Allergen Reactions   Latex Other (See Comments)    Hands blistered and broke open   Neosporin [Neomycin-Bacitracin Zn-Polymyx] Rash    Review of Systems: Negative except as noted in the HPI.   Objective:  Lauren Rosario is a pleasant 71 y.o. female WD, WN in NAD. AAO x 3.  Vascular Examination: Vascular status intact b/l with palpable pedal pulses. CFT immediate b/l. Pedal hair present. No edema. No pain with calf compression b/l. Skin temperature gradient WNL b/l. No varicosities noted. No cyanosis or clubbing noted.  Neurological Examination: Sensation grossly intact b/l with 10 gram monofilament. Vibratory sensation intact b/l.  Dermatological Examination: Pedal skin with normal turgor, texture and tone b/l. No open wounds nor interdigital macerations noted. Toenails 1-5 b/l thick, discolored, elongated with subungual debris and pain on dorsal palpation. No hyperkeratotic lesions noted b/l.   Diffuse scaling noted peripherally and plantarly b/l feet.  No interdigital macerations.  No blisters, no weeping. No signs of secondary bacterial infection noted.   Musculoskeletal Examination: Muscle strength 5/5 to b/l LE.  No pain, crepitus noted b/l. No gross pedal deformities. Patient ambulates independently without assistive aids.   Radiographs: None  Last A1c:      Latest Ref Rng & Units 02/18/2023    8:41 AM 08/16/2022    8:45 AM  Hemoglobin A1C  Hemoglobin-A1c 4.0 - 5.6 % 5.7  6.0      Assessment:   1. Pain due to onychomycosis of toenails of  both feet   2. Tinea pedis of both feet    Plan:  -Patient was evaluated today. All questions/concerns addressed on today's visit. -Patient instructed to spray shoes with Lysol disinfectant every evening. Patient instructed to clean bathtub/shower with bleach based cleanser to avoid reinfection or spreading athlete's feet to others. -For tinea pedis, prescription written for Lotrisone Cream 1%/0.05% to be applied to affected foot/feet twice daily for 6 weeks. -Patient/POA to call should there be question/concern in the interim.  Return in about 3 months (around 07/03/2023).  Freddie Breech, DPM      [facility] LOCATION: [address] [place] [address]                   Office [phone]   [facility] LOCATION: [address] [place], [place] [address] Office [phone]

## 2023-05-12 ENCOUNTER — Other Ambulatory Visit: Payer: Self-pay | Admitting: Podiatry

## 2023-05-12 DIAGNOSIS — B353 Tinea pedis: Secondary | ICD-10-CM

## 2023-06-24 DIAGNOSIS — R208 Other disturbances of skin sensation: Secondary | ICD-10-CM | POA: Diagnosis not present

## 2023-06-24 DIAGNOSIS — L820 Inflamed seborrheic keratosis: Secondary | ICD-10-CM | POA: Diagnosis not present

## 2023-06-24 DIAGNOSIS — L538 Other specified erythematous conditions: Secondary | ICD-10-CM | POA: Diagnosis not present

## 2023-06-24 DIAGNOSIS — L821 Other seborrheic keratosis: Secondary | ICD-10-CM | POA: Diagnosis not present

## 2023-07-04 ENCOUNTER — Ambulatory Visit: Payer: Medicare Other | Admitting: Podiatry

## 2023-07-04 ENCOUNTER — Encounter: Payer: Self-pay | Admitting: Podiatry

## 2023-07-04 DIAGNOSIS — M79674 Pain in right toe(s): Secondary | ICD-10-CM

## 2023-07-04 DIAGNOSIS — M79675 Pain in left toe(s): Secondary | ICD-10-CM | POA: Diagnosis not present

## 2023-07-04 DIAGNOSIS — B351 Tinea unguium: Secondary | ICD-10-CM | POA: Diagnosis not present

## 2023-07-11 NOTE — Progress Notes (Signed)
  Subjective:  Patient ID: Lauren Rosario, female    DOB: Apr 20, 1950,  MRN: [id number]  73 y.o. female presents to clinic with  painful, elongated thickened toenails x 10 which are symptomatic when wearing enclosed shoe gear. This interferes with his/her daily activities.    New problem(s): None   PCP is Margarita Mail, DO.  Allergies  Allergen Reactions   Latex Other (See Comments)    Hands blistered and broke open   Neosporin [Neomycin-Bacitracin Zn-Polymyx] Rash    Review of Systems: Negative except as noted in the HPI.   Objective:  Lauren Rosario is a pleasant 73 y.o. female WD, WN in NAD. AAO x 3.  Vascular Examination: Vascular status intact b/l with palpable pedal pulses. CFT immediate b/l. No edema. No pain with calf compression b/l. Skin temperature gradient WNL b/l. No cyanosis or clubbing noted b/l LE.  Neurological Examination: Sensation grossly intact b/l with 10 gram monofilament. Vibratory sensation intact b/l.   Dermatological Examination: Pedal skin with normal turgor, texture and tone b/l. Toenails 1-5 b/l thick, discolored, elongated with subungual debris and pain on dorsal palpation. No hyperkeratotic lesions noted b/l.   Musculoskeletal Examination: Muscle strength 5/5 to b/l LE. No pain, crepitus or joint limitation noted with ROM bilateral LE. No gross bony deformities bilaterally.  Radiographs: None  Last A1c:      Latest Ref Rng & Units 02/18/2023    8:41 AM 08/16/2022    8:45 AM  Hemoglobin A1C  Hemoglobin-A1c 4.0 - 5.6 % 5.7  6.0      Assessment:   1. Pain due to onychomycosis of toenails of both feet    Plan:  Patient was evaluated and treated. All patient's and/or POA's questions/concerns addressed on today's visit. Toenails 1-5 debrided in length and girth without incident. Continue soft, supportive shoe gear daily. Report any pedal injuries to medical professional. Call office if there are any questions/concerns. -Patient/POA to call  should there be question/concern in the interim.  Return in about 3 months (around 10/04/2023).  Freddie Breech, DPM      [place] LOCATION: [address] [place] [address]                   Office [phone]   [facility] LOCATION: [address] [place], [place] [address] Office [phone]

## 2023-07-25 DIAGNOSIS — Z961 Presence of intraocular lens: Secondary | ICD-10-CM | POA: Diagnosis not present

## 2023-07-25 DIAGNOSIS — H26492 Other secondary cataract, left eye: Secondary | ICD-10-CM | POA: Diagnosis not present

## 2023-07-25 DIAGNOSIS — H43811 Vitreous degeneration, right eye: Secondary | ICD-10-CM | POA: Diagnosis not present

## 2023-08-20 ENCOUNTER — Other Ambulatory Visit: Payer: Self-pay

## 2023-08-20 ENCOUNTER — Encounter: Payer: Self-pay | Admitting: Internal Medicine

## 2023-08-20 ENCOUNTER — Ambulatory Visit (INDEPENDENT_AMBULATORY_CARE_PROVIDER_SITE_OTHER): Admitting: Internal Medicine

## 2023-08-20 VITALS — BP 138/84 | HR 94 | Temp 98.7°F | Ht 61.0 in | Wt 130.6 lb

## 2023-08-20 DIAGNOSIS — R5383 Other fatigue: Secondary | ICD-10-CM

## 2023-08-20 DIAGNOSIS — R634 Abnormal weight loss: Secondary | ICD-10-CM | POA: Diagnosis not present

## 2023-08-20 DIAGNOSIS — R7303 Prediabetes: Secondary | ICD-10-CM

## 2023-08-20 DIAGNOSIS — E785 Hyperlipidemia, unspecified: Secondary | ICD-10-CM

## 2023-08-20 DIAGNOSIS — I10 Essential (primary) hypertension: Secondary | ICD-10-CM

## 2023-08-20 DIAGNOSIS — E559 Vitamin D deficiency, unspecified: Secondary | ICD-10-CM | POA: Diagnosis not present

## 2023-08-20 MED ORDER — HYDROCHLOROTHIAZIDE 12.5 MG PO CAPS: 12.5000 mg | ORAL_CAPSULE | Freq: Every day | ORAL | 1 refills | Status: DC

## 2023-08-20 MED ORDER — CLINDAMYCIN PHOS (ONCE-DAILY) 1 % EX GEL: 1.0000 | Freq: Every day | CUTANEOUS | 1 refills | Status: AC | PRN

## 2023-08-20 MED ORDER — ATORVASTATIN CALCIUM 40 MG PO TABS: 40.0000 mg | ORAL_TABLET | Freq: Every day | ORAL | 1 refills | Status: DC

## 2023-08-20 NOTE — Progress Notes (Signed)
 Established Patient Office Visit  Subjective    Patient ID: Lauren Rosario, female    DOB: Mar 03, 1951  Age: 73 y.o. MRN: [id number]  CC:  Chief Complaint  Patient presents with   Medical Management of Chronic Issues    3 month follow up    HPI Lauren Rosario presents to follow up. Since our last office visit the patient had her teeth pulled and is waiting for dentures. She is not eating much as a result.  Discussed the use of AI scribe software for clinical note transcription with the patient, who gave verbal consent to proceed.  History of Present Illness Lauren Rosario is a 73 year old female who presents for a follow-up visit and routine blood work.  She had her last bottom tooth extracted on April 15th, leading to a significant gum infection. She manages discomfort with Tylenol and warm salt water rinses. There is persistent irritation, especially when eating, but no signs of dry socket.  Since the dental procedure, she has experienced a decrease in appetite and weight loss of approximately ten pounds. She has difficulty consuming food and drinks, including protein shakes, due to a reduced stomach capacity. She maintains hydration with water and occasionally consumes protein drinks and vegetable juices.  She feels consistently fatigued, which she attributes to her recent weight loss and reduced nutritional intake. She is taking vitamin B12 supplements at a dose of 500 mg but continues to feel fatigued.  She is awaiting the fitting of dentures, with an appointment scheduled for May 27th. She currently has upper dentures but finds them uncomfortable without the lower set.  Her current medications include hydrochlorothiazide  12.5 mg and Lipitor 40 mg. She also uses clindamycin  gel for facial skin issues.   Hypertension: -Medications: HCTZ 12.5 mg  -Patient is compliant with above medications and reports no side effects. -Checking BP at home (average): 130/70 -Denies any SOB, CP,  vision changes, LE edema. Dizziness improved.  -Exercise: walking a few days a week   HLD: -Medications: Lipitor 40 mg  -Patient is compliant with above medications and reports no side effects.  -Last lipid panel: Lipid Panel     Component Value Date/Time   CHOL 164 08/16/2022 0845   CHOL 190 07/20/2013 0946   TRIG 75 08/16/2022 0845   HDL 47 (L) 08/16/2022 0845   HDL 49 07/20/2013 0946   CHOLHDL 3.5 08/16/2022 0845   LDLCALC 101 (H) 08/16/2022 0845   LABVLDL 13 07/20/2013 0946    Seasonal Allergies: -Currently on Zyrtec  10 mg OTC daily - sinus issues improved now that her teeth have been pulled  Pre-Diabetes: -A1c 5.7% 11/24 -Not currently on medication  Health Maintenance: -Blood work due -Mammogram: 9/24 - Birads 1 -Colon cancer screening: colonoscopy in the last 10 years, obtaining records -Lung cancer screening due - ordered at [facility], patient received a letter but waiting to schedule   Outpatient Encounter Medications as of 08/20/2023  Medication Sig   atorvastatin  (LIPITOR) 40 MG tablet Take 1 tablet (40 mg total) by mouth daily.   cetirizine  (ZYRTEC ) 10 MG tablet Take 1 tablet (10 mg total) by mouth daily.   clindamycin  (CLINDAGEL) 1 % gel Apply topically 2 (two) times daily.   clotrimazole -betamethasone  (LOTRISONE ) cream APPLY TO BOTH FEET AND BETWEEN TOES TWICE DAILY FOR SIX WEEKS   hydrochlorothiazide  (MICROZIDE ) 12.5 MG capsule Take 1 capsule (12.5 mg total) by mouth daily.   ibuprofen  (ADVIL ) 800 MG tablet Take 1 tablet (800 mg total) by mouth daily as  needed for moderate pain.   No facility-administered encounter medications on file as of 08/20/2023.    Past Medical History:  Diagnosis Date   Allergy    seasonal   Anxiety    Arthritis    Depression    Hyperlipidemia    Hypertension    Neuromuscular disorder (HCC)    Tobacco use disorder     Past Surgical History:  Procedure Laterality Date   CATARACT EXTRACTION W/PHACO Right 06/04/2022   Procedure:  CATARACT EXTRACTION PHACO AND INTRAOCULAR LENS PLACEMENT (IOC) RIGHT  2.94  00:28.8;  Surgeon: Rosa College, MD;  Location: [facility];  Service: Ophthalmology;  Laterality: Right;   CATARACT EXTRACTION W/PHACO Left 06/25/2022   Procedure: CATARACT EXTRACTION PHACO AND INTRAOCULAR LENS PLACEMENT (IOC) LEFT  4.40  00:33.2;  Surgeon: Rosa College, MD;  Location: [facility];  Service: Ophthalmology;  Laterality: Left;   COLON SURGERY  11/2010   Colonoscopy   DENTAL SURGERY     WRIST SURGERY  09/22/2008   left    Family History  Problem Relation Age of Onset   Stroke Mother    Hernia Brother    Heart disease Son        Congenital Heart disease   Breast cancer Maternal Aunt 60    Social History   Socioeconomic History   Marital status: Divorced    Spouse name: Not on file   Number of children: 2   Years of education: Not on file   Highest education level: Not on file  Occupational History   Occupation: Retired  Tobacco Use   Smoking status: Every Day    Current packs/day: 0.50    Average packs/day: 0.5 packs/day for 52.3 years (26.2 ttl pk-yrs)    Types: Cigarettes    Start date: 1973   Smokeless tobacco: Never  Vaping Use   Vaping status: Never Used  Substance and Sexual Activity   Alcohol use: Not Currently   Drug use: No   Sexual activity: Not Currently  Other Topics Concern   Not on file  Social History Narrative   Not on file   Social Drivers of Health   Financial Resource Strain: Low Risk  (02/14/2023)   Overall Financial Resource Strain (CARDIA)    Difficulty of Paying Living Expenses: Not hard at all  Food Insecurity: No Food Insecurity (02/14/2023)   Hunger Vital Sign    Worried About Running Out of Food in the Last Year: Never true    Ran Out of Food in the Last Year: Never true  Transportation Needs: No Transportation Needs (02/14/2023)   PRAPARE - Administrator, Civil Service (Medical): No    Lack of  Transportation (Non-Medical): No  Physical Activity: Sufficiently Active (02/14/2023)   Exercise Vital Sign    Days of Exercise per Week: 3 days    Minutes of Exercise per Session: 50 min  Stress: No Stress Concern Present (02/14/2023)   [employer] of Occupational Health - Occupational Stress Questionnaire    Feeling of Stress : Not at all  Social Connections: Moderately Integrated (02/14/2023)   Social Connection and Isolation Panel [NHANES]    Frequency of Communication with Friends and Family: Three times a week    Frequency of Social Gatherings with Friends and Family: Once a week    Attends Religious Services: More than 4 times per year    Active Member of [employer] or Organizations: Yes    Attends Banker Meetings: More  than 4 times per year    Marital Status: Divorced  Intimate Partner Violence: Not At Risk (02/14/2023)   Humiliation, Afraid, Rape, and Kick questionnaire    Fear of Current or Ex-Partner: No    Emotionally Abused: No    Physically Abused: No    Sexually Abused: No    Review of Systems  Constitutional:  Positive for malaise/fatigue. Negative for chills and fever.  Respiratory:  Negative for shortness of breath.   Cardiovascular:  Negative for chest pain.  Neurological:  Negative for dizziness and headaches.      Objective    BP 138/84 (Cuff Size: Normal)   Pulse 94   Temp 98.7 F (37.1 C) (Oral)   Ht 5\' 1"  (1.549 m)   Wt 130 lb 9.6 oz (59.2 kg)   BMI 24.68 kg/m   Physical Exam Constitutional:      Appearance: Normal appearance.  HENT:     Head: Normocephalic and atraumatic.     Mouth/Throat:     Mouth: Mucous membranes are moist.     Pharynx: Oropharynx is clear.  Eyes:     Conjunctiva/sclera: Conjunctivae normal.  Cardiovascular:     Rate and Rhythm: Normal rate and regular rhythm.  Pulmonary:     Effort: Pulmonary effort is normal.     Breath sounds: Normal breath sounds.  Skin:    General: Skin is warm and dry.   Neurological:     General: No focal deficit present.     Mental Status: She is alert. Mental status is at baseline.  Psychiatric:        Mood and Affect: Mood normal.        Behavior: Behavior normal.      Assessment & Plan:   Assessment & Plan Wellness Visit Routine wellness visit with borderline blood pressure at 138/84 mmHg. Recent dental extractions led to weight loss due to decreased appetite and difficulty eating. Fatigue likely related to nutritional deficiencies from decreased intake. - Order blood work including kidney, liver, electrolytes, cholesterol, A1c, and anemia screening. - Order additional labs for thyroid function, vitamin D, and vitamin B12 levels. - Advise consumption of protein shakes to maintain nutritional intake. - Plan follow-up in six months.  Weight Loss Weight loss likely due to decreased appetite and difficulty eating post-dental extractions. BMI is 24, but further weight loss is discouraged. - Advise consumption of protein shakes to maintain nutritional intake. - Monitor weight and nutritional intake.  Fatigue Fatigue likely related to nutritional deficiencies due to decreased intake post-dental extractions. - Order additional labs for thyroid function, vitamin D, and vitamin B12 levels.  Dental Issues Post-Extraction Recent dental extractions with associated pain and difficulty eating. No signs of infection or dry socket. - Advise use of ibuprofen  for better pain relief than Tylenol. - Monitor for persistent pain and consult dentist if symptoms persist beyond one month post-extraction.  Hypertension Blood pressure at 138/84 mmHg, which is borderline. - Continue hydrochlorothiazide  12.5 mg. -Labs due.  HLD - Recheck lipid panel and continue and refill statin.  Pre-Diabetes: - Recheck A1c.    Return in about 6 months (around 02/20/2024).   Rockney Cid, DO

## 2023-08-21 LAB — COMPLETE METABOLIC PANEL WITHOUT GFR
AG Ratio: 1.3 (calc) (ref 1.0–2.5)
ALT: 7 U/L (ref 6–29)
AST: 13 U/L (ref 10–35)
Albumin: 4.8 g/dL (ref 3.6–5.1)
Alkaline phosphatase (APISO): 83 U/L (ref 37–153)
BUN: 8 mg/dL (ref 7–25)
CO2: 26 mmol/L (ref 20–32)
Calcium: 10.6 mg/dL — ABNORMAL HIGH (ref 8.6–10.4)
Chloride: 102 mmol/L (ref 98–110)
Creat: 0.69 mg/dL (ref 0.60–1.00)
Globulin: 3.6 g/dL (ref 1.9–3.7)
Glucose, Bld: 82 mg/dL (ref 65–99)
Potassium: 3.9 mmol/L (ref 3.5–5.3)
Sodium: 139 mmol/L (ref 135–146)
Total Bilirubin: 0.6 mg/dL (ref 0.2–1.2)
Total Protein: 8.4 g/dL — ABNORMAL HIGH (ref 6.1–8.1)

## 2023-08-21 LAB — VITAMIN B12: Vitamin B-12: 1578 pg/mL — ABNORMAL HIGH (ref 200–1100)

## 2023-08-21 LAB — LIPID PANEL
Cholesterol: 222 mg/dL — ABNORMAL HIGH (ref <200)
HDL: 59 mg/dL (ref >=50)
LDL Cholesterol (Calc): 141 mg/dL — ABNORMAL HIGH
Non-HDL Cholesterol (Calc): 163 mg/dL — ABNORMAL HIGH (ref <130)
Total CHOL/HDL Ratio: 3.8 (calc) (ref <5.0)
Triglycerides: 105 mg/dL (ref <150)

## 2023-08-21 LAB — CBC WITH DIFFERENTIAL/PLATELET
Absolute Lymphocytes: 2873 {cells}/uL (ref 850–3900)
Absolute Monocytes: 383 {cells}/uL (ref 200–950)
Basophils Absolute: 23 {cells}/uL (ref 0–200)
Basophils Relative: 0.3 %
Eosinophils Absolute: 0 {cells}/uL — ABNORMAL LOW (ref 15–500)
Eosinophils Relative: 0 %
HCT: 44.7 % (ref 35.0–45.0)
Hemoglobin: 14.4 g/dL (ref 11.7–15.5)
MCH: 28.2 pg (ref 27.0–33.0)
MCHC: 32.2 g/dL (ref 32.0–36.0)
MCV: 87.6 fL (ref 80.0–100.0)
MPV: 10.3 fL (ref 7.5–12.5)
Monocytes Relative: 5.1 %
Neutro Abs: 4223 {cells}/uL (ref 1500–7800)
Neutrophils Relative %: 56.3 %
Platelets: 315 10*3/uL (ref 140–400)
RBC: 5.1 10*6/uL (ref 3.80–5.10)
RDW: 12.8 % (ref 11.0–15.0)
Total Lymphocyte: 38.3 %
WBC: 7.5 10*3/uL (ref 3.8–10.8)

## 2023-08-21 LAB — HEMOGLOBIN A1C
Hgb A1c MFr Bld: 5.9 % — ABNORMAL HIGH (ref <5.7)
Mean Plasma Glucose: 123 mg/dL
eAG (mmol/L): 6.8 mmol/L

## 2023-08-21 LAB — TSH: TSH: 0.96 m[IU]/L (ref 0.40–4.50)

## 2023-08-21 LAB — VITAMIN D 25 HYDROXY (VIT D DEFICIENCY, FRACTURES): Vit D, 25-Hydroxy: 63 ng/mL (ref 30–100)

## 2023-08-28 ENCOUNTER — Other Ambulatory Visit: Payer: Self-pay | Admitting: Nurse Practitioner

## 2023-08-28 ENCOUNTER — Telehealth: Payer: Self-pay | Admitting: Emergency Medicine

## 2023-08-28 DIAGNOSIS — E785 Hyperlipidemia, unspecified: Secondary | ICD-10-CM

## 2023-08-28 MED ORDER — ATORVASTATIN CALCIUM 80 MG PO TABS: 80.0000 mg | ORAL_TABLET | Freq: Every day | ORAL | 1 refills | Status: DC

## 2023-08-28 NOTE — Telephone Encounter (Signed)
 Per labs Dr. Bud Care wanted patient to increase her Atorvastatin . Can you please send new dose in to pharmacy. I spoke to patient and she stated she was taking the 40 mg now

## 2023-08-29 NOTE — Telephone Encounter (Signed)
 Patient notified of labs.

## 2023-10-07 ENCOUNTER — Ambulatory Visit (INDEPENDENT_AMBULATORY_CARE_PROVIDER_SITE_OTHER): Admitting: Podiatry

## 2023-10-07 DIAGNOSIS — Z91199 Patient's noncompliance with other medical treatment and regimen due to unspecified reason: Secondary | ICD-10-CM

## 2023-10-08 NOTE — Progress Notes (Signed)
 1. No-show for appointment

## 2023-10-10 ENCOUNTER — Ambulatory Visit: Admitting: Podiatry

## 2023-10-10 DIAGNOSIS — M79675 Pain in left toe(s): Secondary | ICD-10-CM

## 2023-10-10 DIAGNOSIS — M79674 Pain in right toe(s): Secondary | ICD-10-CM | POA: Diagnosis not present

## 2023-10-10 DIAGNOSIS — B351 Tinea unguium: Secondary | ICD-10-CM | POA: Diagnosis not present

## 2023-10-10 NOTE — Progress Notes (Signed)
  Subjective:  Patient ID: Lauren Rosario, female    DOB: 26-May-1950,  MRN: [id number]  73 y.o. female presents painful thick toenails that are difficult to trim. Pain interferes with ambulation. Aggravating factors include wearing enclosed shoe gear. Pain is relieved with periodic professional debridement.  Chief Complaint  Patient presents with   Nail Problem    Thick painful toenails, 3 month follow up - patient is asking for a refill of the fungal cream she has been using - she ran out   New problem(s): None   PCP is Bernardo Fend, DO , and last visit was Aug 20, 2023.  Allergies  Allergen Reactions   Latex Other (See Comments)    Hands blistered and broke open   Neosporin [Neomycin-Bacitracin Zn-Polymyx] Rash    Review of Systems: Negative except as noted in the HPI.   Objective:  Lauren Rosario is a pleasant 73 y.o. female WD, WN in NAD. AAO x 3.  Vascular Examination: Vascular status intact b/l with palpable pedal pulses. CFT immediate b/l. Pedal hair present. No edema. No pain with calf compression b/l. Skin temperature gradient WNL b/l. No varicosities noted. No cyanosis or clubbing noted.  Neurological Examination: Sensation grossly intact b/l with 10 gram monofilament. Vibratory sensation intact b/l.  Dermatological Examination: Pedal skin with normal turgor, texture and tone b/l. No open wounds nor interdigital macerations noted. Toenails 1-5 b/l thick, discolored, elongated with subungual debris and pain on dorsal palpation. No hyperkeratotic lesions noted b/l.   Musculoskeletal Examination: Muscle strength 5/5 to b/l LE.  No pain, crepitus noted b/l. No gross pedal deformities. Patient ambulates independently without assistive aids.   Radiographs: None  Last A1c:      Latest Ref Rng & Units 08/20/2023    1:57 PM 02/18/2023    8:41 AM  Hemoglobin A1C  Hemoglobin-A1c <5.7 % 5.9  5.7    Assessment:   1. Pain due to onychomycosis of toenails of both feet     Plan:  Consent given for treatment. Patient examined. All patient's and/or POA's questions/concerns addressed on today's visit. Mycotic toenails 1-5 debrided in length and girth without incident. Continue soft, supportive shoe gear daily. Report any pedal injuries to medical professional. Call office if there are any quesitons/concerns. -Patient/POA to call should there be question/concern in the interim.  Return in about 3 months (around 01/10/2024).  Delon LITTIE Merlin, DPM      [place] LOCATION: [address], [place] [address]                   Office [phone]   [facility] LOCATION: [address] [place], [place] [address] Office [phone]

## 2023-10-13 ENCOUNTER — Encounter: Payer: Self-pay | Admitting: Podiatry

## 2024-01-16 ENCOUNTER — Ambulatory Visit: Admitting: Podiatry

## 2024-01-16 DIAGNOSIS — M79675 Pain in left toe(s): Secondary | ICD-10-CM | POA: Diagnosis not present

## 2024-01-16 DIAGNOSIS — M79674 Pain in right toe(s): Secondary | ICD-10-CM | POA: Diagnosis not present

## 2024-01-16 DIAGNOSIS — B351 Tinea unguium: Secondary | ICD-10-CM

## 2024-01-16 NOTE — Progress Notes (Signed)
  Subjective:  Patient ID: Lauren Rosario, female    DOB: 1951-04-16,  MRN: [id number]  Lauren Rosario presents to clinic today for painful elongated mycotic toenails 1-5 bilaterally which are tender when wearing enclosed shoe gear. Pain is relieved with periodic professional debridement. Patient states her left great toe is tender. Chief Complaint  Patient presents with   Toe Pain    RFC- no other concerns.     PCP is Bernardo Fend, DO. ARNETTA 08/20/2023.  Allergies  Allergen Reactions   Latex Other (See Comments)    Hands blistered and broke open   Neosporin [Neomycin-Bacitracin Zn-Polymyx] Rash   Review of Systems: Negative except as noted in the HPI.  Objective:  There were no vitals filed for this visit. Lauren Rosario is a pleasant 73 y.o. female WD, WN in NAD. AAO x 3.  Neurovascular status intact bilaterally and symmetrically.  Dermatological Examination: Pedal skin with normal turgor, texture and tone b/l. No open wounds nor interdigital macerations noted. Toenails right great toe and 2-5 b/l thick, discolored, elongated with subungual debris and pain on dorsal palpation. No hyperkeratotic lesions noted b/l.   Incurvated nailplate left great toe medial border(s) with tenderness to palpation. No erythema, no edema, no drainage noted.   Musculoskeletal Examination: Muscle strength 5/5 to b/l LE.  No pain, crepitus noted b/l. No gross pedal deformities. Patient ambulates independently without assistive aids.   Radiographs: None  Assessment/Plan: 1. Pain due to onychomycosis of toenails of both feet   Patient was evaluated and treated. All patient's and/or POA's questions/concerns addressed on today's visit.  -Toenails right great toe and 2-5 b/l debrided in length and girth without incident. Continue soft, supportive shoe gear daily. Report any pedal injuries to medical professional. Call office if there are any questions/concerns -Discussed chronicity of ingrown toenail(s)  of left great toe. Recommended patient consider having matrixectomy performed to alleviate chronic ingrown toenail(s). Discussed in-office procedure and post-procedure instructions. Patient declines procedure for now and would like to continue conservative treatment. -No invasive procedure(s) performed. Offending nail border debrided and curretaged medial border left hallux utilizing sterile nail nipper and currette. Border cleansed with alcohol. No further treatment required by patient/caregiver. Call office if there are any concerns.  Return in about 3 months (around 04/17/2024).  Delon LITTIE Merlin, DPM      [place] LOCATION: [address] [place] [address]                   Office [phone]   [facility] LOCATION: [address] [place], [place] [address] Office [phone]

## 2024-01-19 ENCOUNTER — Encounter: Payer: Self-pay | Admitting: Podiatry

## 2024-02-20 ENCOUNTER — Ambulatory Visit: Admitting: Internal Medicine

## 2024-02-20 ENCOUNTER — Ambulatory Visit: Payer: Medicare Other

## 2024-02-21 ENCOUNTER — Ambulatory Visit (INDEPENDENT_AMBULATORY_CARE_PROVIDER_SITE_OTHER): Admitting: Internal Medicine

## 2024-02-21 ENCOUNTER — Other Ambulatory Visit: Payer: Self-pay

## 2024-02-21 ENCOUNTER — Encounter: Payer: Self-pay | Admitting: Internal Medicine

## 2024-02-21 VITALS — BP 132/84 | HR 87 | Temp 98.3°F | Resp 16 | Ht 61.0 in | Wt 128.9 lb

## 2024-02-21 DIAGNOSIS — J302 Other seasonal allergic rhinitis: Secondary | ICD-10-CM

## 2024-02-21 DIAGNOSIS — I10 Essential (primary) hypertension: Secondary | ICD-10-CM | POA: Diagnosis not present

## 2024-02-21 DIAGNOSIS — E785 Hyperlipidemia, unspecified: Secondary | ICD-10-CM

## 2024-02-21 DIAGNOSIS — R7303 Prediabetes: Secondary | ICD-10-CM | POA: Diagnosis not present

## 2024-02-21 DIAGNOSIS — M1990 Unspecified osteoarthritis, unspecified site: Secondary | ICD-10-CM

## 2024-02-21 DIAGNOSIS — Z23 Encounter for immunization: Secondary | ICD-10-CM | POA: Diagnosis not present

## 2024-02-21 DIAGNOSIS — Z1231 Encounter for screening mammogram for malignant neoplasm of breast: Secondary | ICD-10-CM

## 2024-02-21 LAB — POCT GLYCOSYLATED HEMOGLOBIN (HGB A1C): Hemoglobin A1C: 5.5 % (ref 4.0–5.6)

## 2024-02-21 MED ORDER — ATORVASTATIN CALCIUM 80 MG PO TABS: 80.0000 mg | ORAL_TABLET | Freq: Every day | ORAL | 1 refills | Status: AC

## 2024-02-21 MED ORDER — HYDROCHLOROTHIAZIDE 12.5 MG PO CAPS: 12.5000 mg | ORAL_CAPSULE | Freq: Every day | ORAL | 1 refills | Status: AC

## 2024-02-21 MED ORDER — IBUPROFEN 800 MG PO TABS: 800.0000 mg | ORAL_TABLET | Freq: Every day | ORAL | 1 refills | Status: AC | PRN

## 2024-02-21 NOTE — Progress Notes (Signed)
 Established Patient Office Visit  Subjective    Patient ID: Lauren Rosario, female    DOB: Aug 02, 1950  Age: 73 y.o. MRN: [id number]  CC:  Chief Complaint  Patient presents with   Medical Management of Chronic Issues    6 month follow up    HPI Lauren Rosario presents to follow up on chronic medical conditions.   Discussed the use of AI scribe software for clinical note transcription with the patient, who gave verbal consent to proceed.  History of Present Illness  Lauren Rosario is a 73 year old female who presents with weight loss and nutritional concerns after dental procedures.  She experienced significant weight loss after her bottom teeth were extracted, which affected her ability to eat solid foods. Her weight initially fell below 128 pounds but has since returned to this level. Her diet mainly consists of soft, starchy foods such as sweet potatoes, mashed potatoes, rice, and eggs. She discontinued vitamin supplements after elevated levels of vitamin B12, calcium , and vitamin D  were noted.  Her cholesterol levels increased during this period, which she attributes to her diet. She was on Lipitor at the time. Her A1c is 5.9, and her thyroid function is normal. She is on hydrochlorothiazide  for blood pressure, with a recent reading of 132/84 mmHg.  She experiences sinus issues, particularly with wind exposure, causing facial swelling and discomfort. She takes Zyrtec  year-round but feels it is less effective and is considering other antihistamines. She takes ibuprofen  800 mg as needed for pain management.   Hypertension: -Medications: HCTZ 12.5 mg  -Patient is compliant with above medications and reports no side effects. -Checking BP at home (average): 130/70 -Denies any SOB, CP, vision changes, LE edema. Dizziness improved.  -Exercise: walking a few days a week   HLD: -Medications: Lipitor 80 mg  -Patient is compliant with above medications and reports no side effects.  -Last  lipid panel: Lipid Panel     Component Value Date/Time   CHOL 222 (H) 08/20/2023 1357   CHOL 190 07/20/2013 0946   TRIG 105 08/20/2023 1357   HDL 59 08/20/2023 1357   HDL 49 07/20/2013 0946   CHOLHDL 3.8 08/20/2023 1357   LDLCALC 141 (H) 08/20/2023 1357   LABVLDL 13 07/20/2013 0946    Seasonal Allergies: -Currently on Zyrtec  10 mg OTC daily - sinus issues improved now that her teeth have been pulled  Pre-Diabetes: -A1c 5.9% 5/25 -Not currently on medication  Health Maintenance: -Blood work UTD -Mammogram: 9/24 - Birads 1, due -Flu vaccine today, had COVID and RSV boosters as well this year   Outpatient Encounter Medications as of 02/21/2024  Medication Sig   atorvastatin  (LIPITOR) 80 MG tablet Take 1 tablet (80 mg total) by mouth daily.   cetirizine  (ZYRTEC ) 10 MG tablet Take 1 tablet (10 mg total) by mouth daily.   Clindamycin  Phos, Once-Daily, (CLINDAGEL) 1 % GEL Apply 1 Application topically daily as needed.   clotrimazole -betamethasone  (LOTRISONE ) cream APPLY TO BOTH FEET AND BETWEEN TOES TWICE DAILY FOR SIX WEEKS   hydrochlorothiazide  (MICROZIDE ) 12.5 MG capsule Take 1 capsule (12.5 mg total) by mouth daily.   ibuprofen  (ADVIL ) 800 MG tablet Take 1 tablet (800 mg total) by mouth daily as needed for moderate pain.   No facility-administered encounter medications on file as of 02/21/2024.    Past Medical History:  Diagnosis Date   Allergy    seasonal   Anxiety    Arthritis    Depression    Hyperlipidemia  Hypertension    Neuromuscular disorder (HCC)    Tobacco use disorder     Past Surgical History:  Procedure Laterality Date   CATARACT EXTRACTION W/PHACO Right 06/04/2022   Procedure: CATARACT EXTRACTION PHACO AND INTRAOCULAR LENS PLACEMENT (IOC) RIGHT  2.94  00:28.8;  Surgeon: Myrna Adine Anes, MD;  Location[facility];  Service: Ophthalmology;  Laterality: Right;   CATARACT EXTRACTION W/PHACO Left 06/25/2022   Procedure: CATARACT EXTRACTION PHACO  AND INTRAOCULAR LENS PLACEMENT (IOC) LEFT  4.40  00:33.2;  Surgeon: Myrna Adine Anes, MD;  Location: [facility];  Service: Ophthalmology;  Laterality: Left;   COLON SURGERY  11/2010   Colonoscopy   DENTAL SURGERY     WRIST SURGERY  09/22/2008   left    Family History  Problem Relation Age of Onset   Stroke Mother    Hernia Brother    Heart disease Son        Congenital Heart disease   Breast cancer Maternal Aunt 98    Social History   Socioeconomic History   Marital status: Divorced    Spouse name: Not on file   Number of children: 2   Years of education: Not on file   Highest education level: Not on file  Occupational History   Occupation: Retired  Tobacco Use   Smoking status: Every Day    Current packs/day: 0.50    Average packs/day: 0.5 packs/day for 52.8 years (26.4 ttl pk-yrs)    Types: Cigarettes    Start date: 1973   Smokeless tobacco: Never  Vaping Use   Vaping status: Never Used  Substance and Sexual Activity   Alcohol use: Not Currently   Drug use: No   Sexual activity: Not Currently  Other Topics Concern   Not on file  Social History Narrative   Not on file   Social Drivers of Health   Financial Resource Strain: Low Risk  (02/14/2023)   Overall Financial Resource Strain (CARDIA)    Difficulty of Paying Living Expenses: Not hard at all  Food Insecurity: No Food Insecurity (02/14/2023)   Hunger Vital Sign    Worried About Running Out of Food in the Last Year: Never true    Ran Out of Food in the Last Year: Never true  Transportation Needs: No Transportation Needs (02/14/2023)   PRAPARE - Administrator, Civil Service (Medical): No    Lack of Transportation (Non-Medical): No  Physical Activity: Sufficiently Active (02/14/2023)   Exercise Vital Sign    Days of Exercise per Week: 3 days    Minutes of Exercise per Session: 50 min  Stress: No Stress Concern Present (02/14/2023)   [employer] of Occupational Health -  Occupational Stress Questionnaire    Feeling of Stress : Not at all  Social Connections: Moderately Integrated (02/14/2023)   Social Connection and Isolation Panel    Frequency of Communication with Friends and Family: Three times a week    Frequency of Social Gatherings with Friends and Family: Once a week    Attends Religious Services: More than 4 times per year    Active Member of [employer] or Organizations: Yes    Attends Engineer, Structural: More than 4 times per year    Marital Status: Divorced  Intimate Partner Violence: Not At Risk (02/14/2023)   Humiliation, Afraid, Rape, and Kick questionnaire    Fear of Current or Ex-Partner: No    Emotionally Abused: No    Physically Abused: No  Sexually Abused: No    Review of Systems  Constitutional:  Negative for chills and fever.  Respiratory:  Negative for shortness of breath.   Cardiovascular:  Negative for chest pain.  Neurological:  Negative for dizziness and headaches.      Objective    BP 132/84   Pulse 87   Temp 98.3 F (36.8 C) (Oral)   Resp 16   Ht 5' 1 (1.549 m)   Wt 128 lb 14.4 oz (58.5 kg)   SpO2 100%   BMI 24.36 kg/m   Physical Exam Constitutional:      Appearance: Normal appearance.  HENT:     Head: Normocephalic and atraumatic.     Mouth/Throat:     Mouth: Mucous membranes are moist.     Pharynx: Oropharynx is clear.  Eyes:     Extraocular Movements: Extraocular movements intact.     Conjunctiva/sclera: Conjunctivae normal.     Pupils: Pupils are equal, round, and reactive to light.  Neck:     Comments: No thyromegaly  Cardiovascular:     Rate and Rhythm: Normal rate and regular rhythm.  Pulmonary:     Effort: Pulmonary effort is normal.     Breath sounds: Normal breath sounds.  Musculoskeletal:     Cervical back: No tenderness.     Right lower leg: No edema.     Left lower leg: No edema.  Lymphadenopathy:     Cervical: No cervical adenopathy.  Skin:    General: Skin is warm and  dry.  Neurological:     General: No focal deficit present.     Mental Status: She is alert. Mental status is at baseline.  Psychiatric:        Mood and Affect: Mood normal.        Behavior: Behavior normal.      Assessment & Plan:   Assessment & Plan  Hyperlipidemia Cholesterol levels elevated in May, likely due to dietary changes post-dental procedures. Currently on Lipitor. No immediate intervention required. - Recheck cholesterol levels at next visit.  Essential hypertension Blood pressure controlled at 132/84 mmHg on hydrochlorothiazide . - Continue hydrochlorothiazide . - Sent prescription to [facility].  Prediabetes A1c was 5.9% last time. - Rechecked A1c today and was improved to 5.5%.  Arthritis  Stable but needs Ibuprofen  refilled to take as needed.   Allergic rhinitis Sinus issues and facial swelling likely due to allergic rhinitis. Zyrtec  may have reduced efficacy. - Consider alternating Zyrtec  with Allegra or Claritin during off-seasons.  General Health Maintenance All vaccinations up to date. - Continue routine health maintenance and vaccinations as needed. - Mammogram ordered.   - hydrochlorothiazide  (MICROZIDE ) 12.5 MG capsule; Take 1 capsule (12.5 mg total) by mouth daily.  Dispense: 90 capsule; Refill: 1 - atorvastatin  (LIPITOR) 80 MG tablet; Take 1 tablet (80 mg total) by mouth daily.  Dispense: 90 tablet; Refill: 1 - ibuprofen  (ADVIL ) 800 MG tablet; Take 1 tablet (800 mg total) by mouth daily as needed for moderate pain (pain score 4-6).  Dispense: 90 tablet; Refill: 1 - POCT HgB A1C - MM 3D SCREENING MAMMOGRAM BILATERAL BREAST; Future - Flu vaccine HIGH DOSE PF(Fluzone Trivalent)    Return in about 6 months (around 08/20/2024).   Sharyle Fischer, DO

## 2024-04-20 ENCOUNTER — Ambulatory Visit: Admitting: Podiatry

## 2024-04-20 ENCOUNTER — Encounter: Payer: Self-pay | Admitting: Podiatry

## 2024-04-20 DIAGNOSIS — B351 Tinea unguium: Secondary | ICD-10-CM

## 2024-04-20 DIAGNOSIS — M79674 Pain in right toe(s): Secondary | ICD-10-CM | POA: Diagnosis not present

## 2024-04-20 DIAGNOSIS — M79675 Pain in left toe(s): Secondary | ICD-10-CM

## 2024-04-20 DIAGNOSIS — B353 Tinea pedis: Secondary | ICD-10-CM | POA: Diagnosis not present

## 2024-04-20 MED ORDER — CLOTRIMAZOLE-BETAMETHASONE 1-0.05 % EX CREA: TOPICAL_CREAM | CUTANEOUS | 1 refills | Status: AC

## 2024-04-24 NOTE — Progress Notes (Signed)
"  °  Subjective:  Patient ID: Brisia Schuermann, female    DOB: Nov 20, 1950,  MRN: [id number]  Nikea Settle presents to clinic today for painful mycotic toenails x 10 which interfere with daily activities. Pain is relieved with periodic professional debridement. She states her right foot is peeling again and is requesting prescription to manage it. Chief Complaint  Patient presents with   Diabetes    Denies being diabetic. She will see Dr. Bernardo in May 2026    PCP is Bernardo Fend, DO.  Allergies[1]  Review of Systems: Negative except as noted in the HPI.  Objective:  There were no vitals filed for this visit. Milica Gully is a pleasant 74 y.o. female WD, WN in NAD. AAO x 3.  Neurovascular status intact bilaterally and symmetrically.  Dermatological Examination: Pedal skin with normal turgor, texture and tone b/l. No open wounds nor interdigital macerations noted. Toenails right great toe and 2-5 b/l thick, discolored, elongated with subungual debris and pain on dorsal palpation. No hyperkeratotic lesions noted b/l.   Incurvated nailplate left great toe medial border(s) with tenderness to palpation. No erythema, no edema, no drainage noted. Diffuse scaling noted peripherally and plantarly right foot. No interdigital macerations.  No blisters, no weeping. No signs of secondary bacterial infection noted.  Musculoskeletal Examination: Muscle strength 5/5 to b/l LE.  No pain, crepitus noted b/l. No gross pedal deformities. Patient ambulates independently without assistive aids.   Radiographs: None  Assessment/Plan: 1. Pain due to onychomycosis of toenails of both feet   2. Tinea pedis of both feet     Meds ordered this encounter  Medications   clotrimazole -betamethasone  (LOTRISONE ) cream    Sig: APPLY TO BOTH FEET AND BETWEEN TOES TWICE DAILY FOR SIX WEEKS    Dispense:  45 g    Refill:  1  -Consent given for treatment as described below: -Examined patient. -Patient to continue  soft, supportive shoe gear daily. -Mycotic toenails 1-5 bilaterally were debrided in length and girth with sterile nail nippers and dremel without incident. -For tinea pedis, prescription written for Lotrisone  Cream 1%/0.05% to be applied to affected foot/feet twice daily for 6 weeks. -Patient/POA to call should there be question/concern in the interim.   Return in about 3 months (around 07/19/2024).  Delon LITTIE Merlin, DPM      [place] LOCATION: [address], [place] [address]                   Office [phone]   [facility] LOCATION: [address] [place], [place] [address] Office [phone]     [1]  Allergies Allergen Reactions   Latex Other (See Comments)    Hands blistered and broke open   Neosporin [Neomycin-Bacitracin Zn-Polymyx] Rash   "

## 2024-04-26 ENCOUNTER — Other Ambulatory Visit: Payer: Self-pay | Admitting: Internal Medicine

## 2024-04-26 DIAGNOSIS — E785 Hyperlipidemia, unspecified: Secondary | ICD-10-CM

## 2024-04-27 NOTE — Telephone Encounter (Signed)
 Requested Prescriptions  Refused Prescriptions Disp Refills   atorvastatin  (LIPITOR) 40 MG tablet [Pharmacy Med Name: Atorvastatin  Calcium  40 MG Oral Tablet] 90 tablet 0    Sig: Take 1 tablet by mouth once daily     Cardiovascular:  Antilipid - Statins Failed - 04/27/2024  4:47 PM      Failed - Lipid Panel in normal range within the last 12 months    Cholesterol, Total  Date Value Ref Range Status  07/20/2013 190 100 - 199 mg/dL Final   Cholesterol  Date Value Ref Range Status  08/20/2023 222 (H) <200 mg/dL Final   LDL Cholesterol (Calc)  Date Value Ref Range Status  08/20/2023 141 (H) mg/dL (calc) Final    Comment:    Reference range: <100 . Desirable range <100 mg/dL for primary prevention;   <70 mg/dL for patients with CHD or diabetic patients  with > or = 2 CHD risk factors. SABRA LDL-C is now calculated using the Martin-Hopkins  calculation, which is a validated novel method providing  better accuracy than the Friedewald equation in the  estimation of LDL-C.  Gladis APPLETHWAITE et al. SANDREA. 7986;689(80): 2061-2068  (http://education.[facility].com/faq/FAQ164)    HDL  Date Value Ref Range Status  08/20/2023 59 > OR = 50 mg/dL Final  95/93/7984 49 >60 mg/dL Final    Comment:    According to ATP-III Guidelines, HDL-C >59 mg/dL is considered a negative risk factor for CHD.   Triglycerides  Date Value Ref Range Status  08/20/2023 105 <150 mg/dL Final         Passed - Patient is not pregnant      Passed - Valid encounter within last 12 months    Recent Outpatient Visits           2 months ago Essential hypertension, benign   [facility] Bernardo Fend, DO   8 months ago Essential hypertension, benign   [facility] Bernardo Fend, [place]

## 2024-06-25 ENCOUNTER — Ambulatory Visit

## 2024-07-23 ENCOUNTER — Ambulatory Visit: Admitting: Podiatry

## 2024-08-20 ENCOUNTER — Ambulatory Visit: Admitting: Internal Medicine
